# Patient Record
Sex: Female | Born: 1980
Health system: Southern US, Community
[De-identification: ages and names within clinical notes are randomized; demographics above are authoritative.]

## PROBLEM LIST (undated history)

## (undated) ENCOUNTER — Inpatient Hospital Stay (HOSPITAL_COMMUNITY): Payer: Self-pay

## (undated) DIAGNOSIS — Q231 Congenital insufficiency of aortic valve: Secondary | ICD-10-CM

## (undated) DIAGNOSIS — R87619 Unspecified abnormal cytological findings in specimens from cervix uteri: Secondary | ICD-10-CM

## (undated) DIAGNOSIS — IMO0002 Reserved for concepts with insufficient information to code with codable children: Secondary | ICD-10-CM

## (undated) DIAGNOSIS — Q2381 Bicuspid aortic valve: Secondary | ICD-10-CM

## (undated) HISTORY — PX: NO PAST SURGERIES: SHX2092

---

## 2001-10-09 ENCOUNTER — Encounter: Payer: Self-pay | Admitting: Cardiovascular Disease

## 2001-11-11 ENCOUNTER — Encounter: Payer: Self-pay | Admitting: Cardiovascular Disease

## 2002-10-03 ENCOUNTER — Encounter: Payer: Self-pay | Admitting: Cardiovascular Disease

## 2004-07-26 ENCOUNTER — Encounter: Payer: Self-pay | Admitting: Cardiovascular Disease

## 2007-03-26 LAB — CONVERTED CEMR LAB
HDL: 92 mg/dL
LDL Cholesterol: 94 mg/dL

## 2007-03-27 ENCOUNTER — Encounter: Payer: Self-pay | Admitting: Cardiovascular Disease

## 2007-03-27 ENCOUNTER — Encounter: Payer: Self-pay | Admitting: Family Medicine

## 2007-04-10 ENCOUNTER — Encounter: Payer: Self-pay | Admitting: Cardiovascular Disease

## 2007-04-10 ENCOUNTER — Encounter: Payer: Self-pay | Admitting: Family Medicine

## 2007-12-05 ENCOUNTER — Emergency Department: Payer: Self-pay

## 2009-02-28 ENCOUNTER — Emergency Department: Payer: Self-pay | Admitting: Emergency Medicine

## 2009-03-27 ENCOUNTER — Encounter: Payer: Self-pay | Admitting: Family Medicine

## 2009-05-06 ENCOUNTER — Ambulatory Visit: Payer: Self-pay | Admitting: Family Medicine

## 2009-05-06 DIAGNOSIS — G43109 Migraine with aura, not intractable, without status migrainosus: Secondary | ICD-10-CM | POA: Insufficient documentation

## 2009-05-06 DIAGNOSIS — Q231 Congenital insufficiency of aortic valve: Secondary | ICD-10-CM | POA: Insufficient documentation

## 2009-05-06 DIAGNOSIS — R87610 Atypical squamous cells of undetermined significance on cytologic smear of cervix (ASC-US): Secondary | ICD-10-CM | POA: Insufficient documentation

## 2009-05-25 LAB — CONVERTED CEMR LAB: Pap Smear: NORMAL

## 2010-06-07 ENCOUNTER — Ambulatory Visit: Payer: Self-pay | Admitting: Family Medicine

## 2010-06-07 DIAGNOSIS — J309 Allergic rhinitis, unspecified: Secondary | ICD-10-CM | POA: Insufficient documentation

## 2010-06-13 ENCOUNTER — Ambulatory Visit: Payer: Self-pay | Admitting: Cardiovascular Disease

## 2010-06-13 DIAGNOSIS — R0989 Other specified symptoms and signs involving the circulatory and respiratory systems: Secondary | ICD-10-CM | POA: Insufficient documentation

## 2010-06-15 ENCOUNTER — Encounter: Payer: Self-pay | Admitting: Cardiovascular Disease

## 2010-06-22 ENCOUNTER — Encounter: Payer: Self-pay | Admitting: Cardiovascular Disease

## 2010-07-28 ENCOUNTER — Ambulatory Visit: Payer: Self-pay

## 2011-01-24 NOTE — Assessment & Plan Note (Signed)
Summary: NP6/GLC   Visit Type:  Initial Consult Primary Provider:  Kerby Nora MD  CC:  Has episodes of chest pain, left arm numbness, dizziness, and s.o.b. with rapid heart beats.  History of Present Illness: Very pleasant 30 year old woman with a history of bicuspid aortic valve, mild to moderate aortic valve regurgitation, history of  tachy arrhythmias that occur approximately every several months with symptoms of dizziness, shortness of breath and sometimes left arm numbness who presents to establish care.  She was previously seen in [place] and echocardiogram at that time showed mild to moderate AI with bicuspid aortic valve repeat echocardiogram the following year showed no significant change. Notes to indicate that she was having atypical type chest pains with prolonged episodes of chest heaviness with no radiation. She was active but this did not seem to exacerbate her symptoms.  In August 2005 she had atypical type chest pain, sinus tachycardia. EKG showed normal sinus rhythm with a rate of 111 beats per minute.  She does report some episodes of tachycardia lasting for approximately 15 minutes. Occasionally she will have a severe episode lasting for one hour that is much faster and stronger. Her last episode was in December which was significant and she did not go to the emergency room.  EKG shows normal sinus rhythm with rate 73 beats per minute no significant ST or T wave changes.  Last echocardiogram was done at [facility] cardiology and will try to obtain this for her records  Current Medications (verified): 1)  Triphasil .... Take 1 Tablet By Mouth Once A Day 2)  Multivitamins   Tabs (Multiple Vitamin) .... Take 1 Tablet By Mouth Once A Day 3)  Patanol 0.1 % Soln (Olopatadine Hcl) .Marland Kitchen.. 1 Gtt in Eyes Two Times A Day 4)  Zyrtec Allergy 10 Mg Tabs (Cetirizine Hcl) .Marland Kitchen.. 1 Qd  Allergies (verified): 1)  ! Sulfa 2)  ! Penicillin  Past History:  Past Medical History: "Mild to  moderate aortic insufficiency and suggests congenital bicuspid aortic valve";Echo 10/09/2001  Review of Systems  The patient denies fever, weight loss, weight gain, vision loss, decreased hearing, hoarseness, chest pain, syncope, dyspnea on exertion, peripheral edema, prolonged cough, abdominal pain, incontinence, muscle weakness, depression, and enlarged lymph nodes.    Vital Signs:  Patient profile:   30 year old female Height:      62.25 inches Weight:      117 pounds BMI:     21.30 Pulse rate:   73 / minute BP sitting:   106 / 78  (left arm) Cuff size:   regular  Vitals Entered By: Bishop Dublin, CMA (June 13, 2010 3:18 PM)  Physical Exam  General:  Well developed, well nourished, in no acute distress. Head:  normocephalic and atraumatic Neck:  Neck supple, no JVD. No masses, thyromegaly or abnormal cervical nodes. Lungs:  Clear bilaterally to auscultation and percussion. Heart:  Non-displaced PMI, chest non-tender; regular rate and rhythm, S1, S2 with I-II/VI diastolic murmur at RSB, no rubs or gallops. Carotid upstroke normal, no bruit.  Pedals normal pulses. No edema, no varicosities. Abdomen:  Bowel sounds positive; abdomen soft and non-tender without masses Msk:  Back normal, normal gait. Muscle strength and tone normal. Pulses:  pulses normal in all 4 extremities Extremities:  No clubbing or cyanosis. Neurologic:  Alert and oriented x 3. Skin:  Intact without lesions or rashes. Psych:  Normal affect.   Impression & Recommendations:  Problem # 1:  BICUSPID AORTIC VALVE (ICD-746.4) history  of bicuspid aortic valve with mild to moderate AI (mild by echocardiogram in 2003), with latest echocardiogram not available to [place] at this time from 2-3 years ago which was reportedly unchanged from previous echoes. We will order an echocardiogram to evaluate her valve as it has been several years . Orders: Echocardiogram (Echo)  Problem # 2:  TACHYCARDIA (ICD-785) She does report  periods of tachycardia present every several months. I suspect she may have an SVT or an atrial tachycardia. She is not interested in trying medications at this time as they are very infrequent. I've asked her if she has worsening episodes were more frequent, to contact our office for further evaluation.  Patient Instructions: 1)  Your physician wants you to follow-up in:  1 year  You will receive a reminder letter in the mail two months in advance. If you don't receive a letter, please call our office to schedule the follow-up appointment. 2)  Your physician has requested that you have an echocardiogram.  Echocardiography is a painless test that uses sound waves to create images of your heart. It provides your doctor with information about the size and shape of your heart and how well your heart's chambers and valves are working.  This procedure takes approximately one hour. There are no restrictions for this procedure. ANY THURSDAY

## 2011-01-24 NOTE — Assessment & Plan Note (Signed)
Summary: ALLERGIES/DLO   Vital Signs:  Patient profile:   30 year old female Height:      62.25 inches Weight:      115.0 pounds BMI:     20.94 Temp:     98.0 degrees F oral Pulse rate:   68 / minute Pulse rhythm:   regular BP sitting:   90 / 60  (left arm) Cuff size:   regular  Vitals Entered By: Benny Lennert CMA Duncan Dull) (June 07, 2010 11:02 AM)  History of Present Illness: Chief complaint Allergies  In last 2 weeks.Marland Kitchen eyes watering, burning, sinuss pressure, nasal congestion, post nasal drip. Lightheaded, headache. Mild ear pain B.  No fever. No cough, no SOB. Fatigued.  Tried advil sudafed, claritin...minimal improvement. No new environmental triggers.   Also requests cardiology referrla for bicuspid aortic valve..due for q3 year ECHo.   Asymptomatic.   Sees GYN for CPX.    Problems Prior to Update: 1)  Pap Smear, Abnormal, Ascus  (ICD-795.01) 2)  Bicuspid Aortic Valve  (ICD-746.4) 3)  Migraine With Aura  (ICD-346.00)  Current Medications (verified): 1)  Triphasil .... Take 1 Tablet By Mouth Once A Day 2)  Multivitamins   Tabs (Multiple Vitamin) .... Take 1 Tablet By Mouth Once A Day  Allergies: 1)  ! Sulfa 2)  ! Penicillin  Past History:  Past medical, surgical, family and social histories (including risk factors) reviewed, and no changes noted (except as noted below).  Past Surgical History: Reviewed history from 05/06/2009 and no changes required. Denies surgical history Colopscopy:negative...................Marland Kitchen Dr [facility]  Family History: Reviewed history from 05/06/2009 and no changes required. father: HTN, goiter mother:high chol, HTN, hyperthyroid after pregnancy brother:healthy sister: healthy PGF: heart disease of some type, ? cancer MGF: CAD, CABG age 105 MGM: CVA age 99, high chol  Social History: Reviewed history from 05/06/2009 and no changes required. Occupation: Airline pilot Married no children Never Smoked Alcohol  use-no Drug use-yes, remote, no injectables Regular exercise-no Diet: fruits and veggies, water  Review of Systems General:  Denies fatigue and fever. CV:  Denies chest pain or discomfort, fatigue, near fainting, palpitations, swelling of feet, and weight gain. Resp:  Denies chest discomfort. GI:  Denies abdominal pain. GU:  Denies dysuria.  Physical Exam  General:  Well-developed,well-nourished,in no acute distress; alert,appropriate and cooperative throughout examination Head:  no maxilalr sinus ttp Eyes:  vision grossly intact, pupils equal, pupils round, pupils reactive to light, and conjunctival injection.   Ears:  External ear exam shows no significant lesions or deformities.  Otoscopic examination reveals clear canals, tympanic membranes are intact bilaterally without bulging, retraction, inflammation or discharge. Hearing is grossly normal bilaterally. Nose:  nasal dischargemucosal pallor.   Mouth:  Oral mucosa and oropharynx without lesions or exudates.  Teeth in good repair. Neck:  no carotid bruit or thyromegaly no cervical or supraclavicular lymphadenopathy  Lungs:  Normal respiratory effort, chest expands symmetrically. Lungs are clear to auscultation, no crackles or wheezes. Heart:  1/6 dias murmur greatest over RUSB, no rub, no gallop Pulses:  R and L posterior tibial pulses are full and equal bilaterally  Extremities:  no edema   Impression & Recommendations:  Problem # 1:  ALLERGIC RHINITIS WITH CONJUNCTIVITIS (ICD-477.9)  Discussed use of allergy medications and environmental measures.  Start zyrtec and pataday.  Call if symptoms are not improving.   Problem # 2:  BICUSPID AORTIC VALVE (ICD-746.4) Asymptomatic.Marland Kitchendue for q 3 year ECHO evasl. Does not have cardiologist in area.  Reviewed past MD notes.  Orders: Cardiology Referral (Cardiology)  Problem # 3:  Preventive Health Care (ICD-V70.0) Assessment: Comment Only Reviewed preventive care protocols,  scheduled due services, and updated immunizations.   Complete Medication List: 1)  Triphasil  .... Take 1 tablet by mouth once a day 2)  Multivitamins Tabs (Multiple vitamin) .... Take 1 tablet by mouth once a day 3)  Patanol 0.1 % Soln (Olopatadine hcl) .Marland Kitchen.. 1 gtt in eyes two times a day  Patient Instructions: 1)  Zyrtec at bedtime ..in no relief try allegra. 2)  Patanol eye drops for eye symptoms as needed. 3)  Call if not improving in next few weeks.  Prescriptions: PATANOL 0.1 % SOLN (OLOPATADINE HCL) 1 gtt in eyes two times a day  #1 x 2   Entered and Authorized by:   Kerby Nora MD   Signed by:   Kerby Nora MD on 06/07/2010   Method used:   [facility]        [facility]       [address]       [place], [place]  [address]       Ph: [phone]       Fax: [phone]   RxID:   [id number]   Current Allergies (reviewed today): ! SULFA ! PENICILLIN  TD Result Date:  12/25/2008 TD Result:  given TD Next Due:  10 yr Last PAP:  abnormal (03/25/2008 8:45:42 AM) PAP Result Date:  05/25/2009 PAP Result:  normal PAP Next Due:  1 yr

## 2011-01-24 NOTE — Letter (Signed)
Summary: record release  record release   Imported By: Frazier Butt Chriscoe 06/22/2010 15:32:54  _____________________________________________________________________  External Attachment:    Type:   Image     Comment:   External Document

## 2011-01-24 NOTE — Letter (Signed)
Summary: PHI  PHI   Imported By: Harlon Flor 06/15/2010 08:47:19  _____________________________________________________________________  External Attachment:    Type:   Image     Comment:   External Document

## 2011-05-26 ENCOUNTER — Inpatient Hospital Stay (HOSPITAL_COMMUNITY): Admit: 2011-05-26 | Payer: Self-pay | Admitting: Obstetrics & Gynecology

## 2011-07-19 ENCOUNTER — Encounter: Payer: Self-pay | Admitting: Cardiovascular Disease

## 2011-08-25 LAB — HEPATITIS B SURFACE ANTIGEN: Hepatitis B Surface Ag: NEGATIVE

## 2011-08-25 LAB — HIV ANTIBODY (ROUTINE TESTING W REFLEX): HIV: NONREACTIVE

## 2011-08-25 LAB — ABO/RH: RH Type: POSITIVE

## 2011-08-25 LAB — RUBELLA ANTIBODY, IGM: Rubella: IMMUNE

## 2011-12-26 NOTE — L&D Delivery Note (Signed)
Delivery Note At 9:02 PM a viable female was delivered via Vaginal, Spontaneous Delivery (Presentation DOA w/ double nuchal arms).  APGAR: 8, 8; weight 6 lb 14.9 oz (3145 g).  Over last 20 minutes baby w/ decels to 80-90's that lasted up to 1-2 min with pushing, good recovery btwn ctx but over the last 5-10 min recovery only to to the low 100s. Placenta status: Intact, w/ pitocin, uterine massage and maternal pushing after 25 minutes, Spontaneous.  Cord: 3 vessels with the following complications: None.  Cord pH: 7.29  Anesthesia: Local  Episiotomy: None Lacerations: 2nd degree;Perineal, L periurethral Suture Repair: 2.0 3.0 vicryl rapide. 2nd degree laceration repaired with 2-0 vicryl rapide in standard fashion, L periurethral splaying closed w/ 4-0 vicryl rapide and R labia majora splaying reapproximated with single figure of 8 of 3-0 vicryl. Est. Blood Loss (mL): 500cc  Mom to postpartum.  Baby to nursery-stable.  Lindsey Juarez A. 04/05/2012, 10:32 PM

## 2012-04-05 ENCOUNTER — Encounter (HOSPITAL_COMMUNITY): Payer: Self-pay | Admitting: *Deleted

## 2012-04-05 ENCOUNTER — Inpatient Hospital Stay (HOSPITAL_COMMUNITY)
Admission: AD | Admit: 2012-04-05 | Discharge: 2012-04-07 | DRG: 373 | Disposition: A | Discharge status: Home / Self Care | Payer: BC Managed Care – PPO | Source: Ambulatory Visit | Attending: Obstetrics & Gynecology | Admitting: Obstetrics & Gynecology

## 2012-04-05 DIAGNOSIS — O99892 Other specified diseases and conditions complicating childbirth: Secondary | ICD-10-CM | POA: Diagnosis present

## 2012-04-05 DIAGNOSIS — O9903 Anemia complicating the puerperium: Secondary | ICD-10-CM | POA: Diagnosis not present

## 2012-04-05 DIAGNOSIS — R55 Syncope and collapse: Secondary | ICD-10-CM | POA: Diagnosis present

## 2012-04-05 DIAGNOSIS — Q231 Congenital insufficiency of aortic valve: Secondary | ICD-10-CM

## 2012-04-05 DIAGNOSIS — D62 Acute posthemorrhagic anemia: Secondary | ICD-10-CM | POA: Diagnosis not present

## 2012-04-05 DIAGNOSIS — I359 Nonrheumatic aortic valve disorder, unspecified: Secondary | ICD-10-CM

## 2012-04-05 HISTORY — DX: Bicuspid aortic valve: Q23.81

## 2012-04-05 HISTORY — DX: Reserved for concepts with insufficient information to code with codable children: IMO0002

## 2012-04-05 HISTORY — DX: Unspecified abnormal cytological findings in specimens from cervix uteri: R87.619

## 2012-04-05 HISTORY — DX: Congenital insufficiency of aortic valve: Q23.1

## 2012-04-05 LAB — CBC
MCH: 31.4 pg (ref 26.0–34.0)
MCHC: 34.5 g/dL (ref 30.0–36.0)
Platelets: 249 10*3/uL (ref 150–400)
RDW: 13 % (ref 11.5–15.5)

## 2012-04-05 LAB — TYPE AND SCREEN: Antibody Screen: NEGATIVE

## 2012-04-05 LAB — ABO/RH: ABO/RH(D): A POS

## 2012-04-05 LAB — GC/CHLAMYDIA PROBE AMP, GENITAL: Gonorrhea: NEGATIVE

## 2012-04-05 MED ORDER — OXYTOCIN BOLUS FROM INFUSION
500.0000 mL | Freq: Once | INTRAVENOUS | Status: DC
  Filled 2012-04-05: qty 1000
  Filled 2012-04-05: qty 500

## 2012-04-05 MED ORDER — LIDOCAINE HCL (PF) 1 % IJ SOLN
30.0000 mL | INTRAMUSCULAR | Status: DC | PRN
  Administered 2012-04-05: 30 mL via SUBCUTANEOUS
  Filled 2012-04-05: qty 30

## 2012-04-05 MED ORDER — CITRIC ACID-SODIUM CITRATE 334-500 MG/5ML PO SOLN: 30.0000 mL | ORAL | Status: DC | PRN

## 2012-04-05 MED ORDER — ONDANSETRON HCL 4 MG/2ML IJ SOLN: 4.0000 mg | Freq: Four times a day (QID) | INTRAMUSCULAR | Status: DC | PRN

## 2012-04-05 MED ORDER — IBUPROFEN 600 MG PO TABS: 600.0000 mg | ORAL_TABLET | Freq: Four times a day (QID) | ORAL | Status: DC | PRN

## 2012-04-05 MED ORDER — PHENYLEPHRINE 40 MCG/ML (10ML) SYRINGE FOR IV PUSH (FOR BLOOD PRESSURE SUPPORT)
PREFILLED_SYRINGE | INTRAVENOUS | Status: AC
  Administered 2012-04-05: 80 ug via INTRAVENOUS
  Filled 2012-04-05: qty 5

## 2012-04-05 MED ORDER — OXYCODONE-ACETAMINOPHEN 5-325 MG PO TABS: 1.0000 | ORAL_TABLET | ORAL | Status: DC | PRN

## 2012-04-05 MED ORDER — OXYTOCIN 20 UNITS IN LACTATED RINGERS INFUSION - SIMPLE
125.0000 mL/h | Freq: Once | INTRAVENOUS | Status: AC
  Administered 2012-04-05: 125 mL/h via INTRAVENOUS

## 2012-04-05 MED ORDER — PHENYLEPHRINE 40 MCG/ML (10ML) SYRINGE FOR IV PUSH (FOR BLOOD PRESSURE SUPPORT)
80.0000 ug | PREFILLED_SYRINGE | INTRAVENOUS | Status: DC | PRN
  Administered 2012-04-05: 80 ug via INTRAVENOUS

## 2012-04-05 MED ORDER — LACTATED RINGERS IV SOLN
500.0000 mL | INTRAVENOUS | Status: DC | PRN
  Administered 2012-04-05: 1000 mL via INTRAVENOUS

## 2012-04-05 MED ORDER — FLEET ENEMA 7-19 GM/118ML RE ENEM: 1.0000 | ENEMA | RECTAL | Status: DC | PRN

## 2012-04-05 MED ORDER — ACETAMINOPHEN 325 MG PO TABS: 650.0000 mg | ORAL_TABLET | ORAL | Status: DC | PRN

## 2012-04-05 MED ORDER — LACTATED RINGERS IV SOLN
INTRAVENOUS | Status: DC
  Administered 2012-04-05: 19:00:00 via INTRAVENOUS

## 2012-04-05 NOTE — Progress Notes (Signed)
Pt delivered viable female with APGARS 8,9 SVD. Dr Ernestina Penna present.

## 2012-04-05 NOTE — Progress Notes (Signed)
Pt transferred to room 372 ICU via stretcher in stable condition. Report given to RN Steward Drone

## 2012-04-05 NOTE — Progress Notes (Signed)
MD at bedside for anesthesia consult.

## 2012-04-05 NOTE — MAU Note (Signed)
Pt in for labor eval, reports ucs since last night, have progressively gotten worse throughout day.  Reports small amount of vaginal bleeding.  + FM.

## 2012-04-05 NOTE — H&P (Signed)
Lindsey Juarez is a 31 y.o. G1P0 at [redacted]w[redacted]d presenting for labor. Pt notes onset contractions last night but they started getting significantly worse later this afternoon . Good fetal movement, No vaginal bleeding,  leaking fluid after ROM en route from MAU to L&D.  [facility] at [facility] since 7 wks - dated by LMP - bicuspid aortic valve and aortic insufficiency, pt states last echo 2 yrs echo. Dx at age 71 and pt states no changes on echo qoyr. Pt states cards told her she would not have a problem with pregnancy as her insufficiency is mild. No maternal or fetal echo this preg - migraines- controlled w/ diet - u/s at 36 wks- ant placenta, growth 55%  Meds: PNV, Zyrtec All: Sulfa (hives), PCN (face rash, no hives as a child)  OB History    Grav Para Term Preterm Abortions TAB SAB Ect Mult Living   1              Past Medical History  Diagnosis Date  . Aortic insufficiency     mild-moderate; suggests cogenital bicuspid aortic vlave; echo 10/09/01  . Abnormal Pap smear   . ASCUS on Pap smear   . Bicuspid aortic valve    Past Surgical History  Procedure Date  . No past surgeries    Family History: family history includes Coronary artery disease in her maternal grandfather; Goiter in her father; Heart disease in her paternal grandfather; Hypertension in her father and mother; and Stroke (age of onset:40) in her maternal grandmother.  There is no history of Anesthesia problems. Social History:  reports that she quit smoking about 13 years ago. She does not have any smokeless tobacco history on file. She reports that she does not drink alcohol or use illicit drugs.  Review of Systems - Negative except ctx, LOF   Dilation: 4.5 Effacement (%): 100 Station: -1 Exam by:: Cletis Media, RN Blood pressure 136/77, pulse 73, temperature 97.9 F (36.6 C), temperature source Oral, resp. rate 20, height 5\' 2"  (1.575 m), weight 61.236 kg (135 lb).  Physical Exam: well appearing, uncomfortable  with ctx Gen: well appearing, no distress CV: RRR Pulm: CTAB, no wheeze Back: no CVAT Abd: gravid, NT, no RUQ pain LE: no edema, equal bilaterally, non-tender Toco: q 3 min FH: baseline 130s, accelerations present, no deceleratons, 10 beat variability  Prenatal labs: ABO, Rh: A/Positive/-- (08/31 0000) Antibody: Negative (08/31 0000) Rubella:  immune RPR: Nonreactive (08/31 0000)  HBsAg: Negative (08/31 0000)  HIV: Non-reactive (08/31 0000)  GBS: Negative (03/14 0000)  1 hr Glucola 101   Genetic screening nl AFP Anatomy US nl female   Assessment/Plan: 31 y.o. G1P0 at [redacted]w[redacted]d Active labor. Expectant management at this time Reactive fetal testing GBS neg Aortic insufficiency, due to the risk of acute decompensation with fluid shifts of labor and delivery, along with increased risks w/ anasthetics/ need for surgery, will obtain stat echo and cards input.   Lindsey Juarez A. 04/05/2012 7:36 PM     Lindsey Juarez A. 04/05/2012, 7:24 PM

## 2012-04-06 ENCOUNTER — Encounter (HOSPITAL_COMMUNITY): Payer: Self-pay | Admitting: Cardiovascular Disease

## 2012-04-06 DIAGNOSIS — R55 Syncope and collapse: Secondary | ICD-10-CM | POA: Diagnosis present

## 2012-04-06 DIAGNOSIS — I359 Nonrheumatic aortic valve disorder, unspecified: Secondary | ICD-10-CM

## 2012-04-06 LAB — CBC
MCV: 92.2 fL (ref 78.0–100.0)
Platelets: 232 10*3/uL (ref 150–400)
RBC: 2.94 MIL/uL — ABNORMAL LOW (ref 3.87–5.11)
RDW: 13.1 % (ref 11.5–15.5)
WBC: 25.8 10*3/uL — ABNORMAL HIGH (ref 4.0–10.5)

## 2012-04-06 LAB — MRSA PCR SCREENING: MRSA by PCR: NEGATIVE

## 2012-04-06 MED ORDER — DIBUCAINE 1 % RE OINT: 1.0000 "application " | TOPICAL_OINTMENT | RECTAL | Status: DC | PRN

## 2012-04-06 MED ORDER — ONDANSETRON HCL 4 MG PO TABS: 4.0000 mg | ORAL_TABLET | ORAL | Status: DC | PRN

## 2012-04-06 MED ORDER — SENNOSIDES-DOCUSATE SODIUM 8.6-50 MG PO TABS
2.0000 | ORAL_TABLET | Freq: Every day | ORAL | Status: DC
  Administered 2012-04-06: 2 via ORAL

## 2012-04-06 MED ORDER — BENZOCAINE-MENTHOL 20-0.5 % EX AERO
INHALATION_SPRAY | CUTANEOUS | Status: AC
  Administered 2012-04-06: 03:00:00
  Filled 2012-04-06: qty 56

## 2012-04-06 MED ORDER — WITCH HAZEL-GLYCERIN EX PADS: 1.0000 "application " | MEDICATED_PAD | CUTANEOUS | Status: DC | PRN

## 2012-04-06 MED ORDER — TETANUS-DIPHTH-ACELL PERTUSSIS 5-2.5-18.5 LF-MCG/0.5 IM SUSP
0.5000 mL | Freq: Once | INTRAMUSCULAR | Status: DC
  Filled 2012-04-06: qty 0.5

## 2012-04-06 MED ORDER — BISACODYL 10 MG RE SUPP: 10.0000 mg | Freq: Every day | RECTAL | Status: DC | PRN

## 2012-04-06 MED ORDER — PRENATAL MULTIVITAMIN CH
1.0000 | ORAL_TABLET | Freq: Every day | ORAL | Status: DC
  Administered 2012-04-06: 1 via ORAL
  Filled 2012-04-06: qty 1

## 2012-04-06 MED ORDER — OXYCODONE-ACETAMINOPHEN 5-325 MG PO TABS
1.0000 | ORAL_TABLET | ORAL | Status: DC | PRN
Start: 2012-04-06 — End: 2012-04-07

## 2012-04-06 MED ORDER — SIMETHICONE 80 MG PO CHEW
80.0000 mg | CHEWABLE_TABLET | ORAL | Status: DC | PRN
  Administered 2012-04-06: 80 mg via ORAL

## 2012-04-06 MED ORDER — IBUPROFEN 600 MG PO TABS
600.0000 mg | ORAL_TABLET | Freq: Four times a day (QID) | ORAL | Status: DC
  Administered 2012-04-06 – 2012-04-07 (×6): 600 mg via ORAL
  Filled 2012-04-06 (×6): qty 1

## 2012-04-06 MED ORDER — LANOLIN HYDROUS EX OINT: TOPICAL_OINTMENT | CUTANEOUS | Status: DC | PRN

## 2012-04-06 MED ORDER — DIPHENHYDRAMINE HCL 25 MG PO CAPS
25.0000 mg | ORAL_CAPSULE | Freq: Four times a day (QID) | ORAL | Status: DC | PRN
Start: 2012-04-06 — End: 2012-04-07

## 2012-04-06 MED ORDER — BENZOCAINE-MENTHOL 20-0.5 % EX AERO: 1.0000 "application " | INHALATION_SPRAY | CUTANEOUS | Status: DC | PRN

## 2012-04-06 MED ORDER — LACTATED RINGERS IV SOLN: INTRAVENOUS | Status: DC

## 2012-04-06 MED ORDER — ONDANSETRON HCL 4 MG/2ML IJ SOLN: 4.0000 mg | INTRAMUSCULAR | Status: DC | PRN

## 2012-04-06 MED ORDER — FLEET ENEMA 7-19 GM/118ML RE ENEM: 1.0000 | ENEMA | Freq: Every day | RECTAL | Status: DC | PRN

## 2012-04-06 MED ORDER — ZOLPIDEM TARTRATE 5 MG PO TABS: 5.0000 mg | ORAL_TABLET | Freq: Every evening | ORAL | Status: DC | PRN

## 2012-04-06 NOTE — Progress Notes (Signed)
04/06/12 1410 SBar report called to Kris Hartmann, RN for pt transfer to [facility] rm #[id number].

## 2012-04-06 NOTE — Consult Note (Signed)
Reason for Consult:Syncope, abnormal cardiac valves Referring Physician: Dr Windell Moment Lindsey Juarez is an 31 y.o. female with a history of palpitations, bicuspid aortic valve with varying degrees of aortic insufficiency over the years(mild to moderate).  HPI: She has been followed in the past by Dr. Dossie Arbour. She was diagnosed in 2002 at the age of 18 yrs while being evaluated for her palpitations. She used to have 6 monthly, then yearly, then q3 yrly echo but was never started on any medication or offered surgery. She just had an uneventful pregnancy and [facility].Following delivery, she went to the bathroom and while on the toilet seat, she started feeling lightheaded and nauseated and blacked out. She was held up by the nurses who helped her off the toilet seat. Her BP dropped to a systolic of while her heart rate that had consistently being in the 90s to 100 dropped down to the 50s. This episode quickly resolved and her vitals recovered with time and infusion of 1 liter bag of saline. At the time of evaluation she was back to her normal self and had no complaints. Of note she has been having 1-2 episodes a year of rapid sensation of the heart beating. The suspicion is that she has an "electrical problem" (arrhythmia most likely SVT) and has been told to present to the hospital in to document it on EKG the next time it happens. She particularly denied having palpitations last night.   Past Medical History  Diagnosis Date  . Aortic insufficiency     mild-moderate; suggests cogenital bicuspid aortic vlave; echo 10/09/01  . Abnormal Pap smear   . ASCUS on Pap smear   . Bicuspid aortic valve     Past Surgical History  Procedure Date  . No past surgeries     Family History  Problem Relation Age of Onset  . Goiter Father   . Hypertension Father   . Hypertension Mother     also high chol and hyperthyroid after pregnancy  . Heart disease  Paternal Grandfather     ?cancer  . Coronary artery disease Maternal Grandfather     CABG at 48  . Stroke Maternal Grandmother 40    high chol  . Anesthesia problems Neg Hx     Social History:  reports that she quit smoking about 13 years ago. She does not have any smokeless tobacco history on file. She reports that she does not drink alcohol or use illicit drugs.  Allergies:  Allergies  Allergen Reactions  . Penicillins     REACTION: Rash  . Sulfonamide Derivatives     REACTION: Hives, swelling    Medications: I have reviewed the patient's current medications.  Results for orders placed during the hospital encounter of 04/05/12 (from the past 48 hour(s))  GC/CHLAMYDIA PROBE AMP, GENITAL     Status: Normal      Component Value Range Comment   Gonorrhea Negative      Chlamydia Negative     CBC     Status: Abnormal   Collection Time   04/05/12  7:15 PM      Component Value Range Comment   WBC 17.3 (*) 4.0 - 10.5 (K/uL)    RBC 3.82 (*) 3.87 - 5.11 (MIL/uL)    Hemoglobin 12.0  12.0 - 15.0 (g/dL)    HCT 16.1 (*) 09.6 - 46.0 (%)    MCV 91.1  78.0 - 100.0 (fL)    MCH  31.4  26.0 - 34.0 (pg)    MCHC 34.5  30.0 - 36.0 (g/dL)    RDW 16.1  09.6 - 04.5 (%)    Platelets 249  150 - 400 (K/uL)   RPR     Status: Normal   Collection Time   04/05/12  7:15 PM      Component Value Range Comment   RPR NON REACTIVE  NON REACTIVE    TYPE AND SCREEN     Status: Normal   Collection Time   04/05/12  7:15 PM      Component Value Range Comment   ABO/RH(D) A POS      Antibody Screen NEG      Sample Expiration 04/08/2012     ABO/RH     Status: Normal   Collection Time   04/05/12  7:15 PM      Component Value Range Comment   ABO/RH(D) A POS     MRSA PCR SCREENING     Status: Normal   Collection Time   04/06/12 12:40 AM      Component Value Range Comment   MRSA by PCR NEGATIVE  NEGATIVE      No results found.  Review of Systems  Constitutional: Negative for diaphoresis.  HENT: Negative.    Eyes: Negative.   Respiratory: Negative.   Cardiovascular: Negative for chest pain, palpitations, orthopnea, claudication, leg swelling and PND.  Gastrointestinal: Positive for nausea.  Genitourinary: Negative.   Musculoskeletal: Negative.   Skin: Negative.   Neurological: Positive for dizziness, loss of consciousness and weakness.  Endo/Heme/Allergies: Negative.   Psychiatric/Behavioral: Negative.    Blood pressure 93/52, pulse 74, temperature 98.4 F (36.9 C), temperature source Oral, resp. rate 16, height 5\' 3"  (1.6 m), weight 129 lb 12.8 oz (58.877 kg), SpO2 99.00%, unknown if currently breastfeeding. Physical Exam  Constitutional: She is oriented to person, place, and time. She appears well-developed and well-nourished. No distress.  HENT:  Head: Normocephalic.  Mouth/Throat: No oropharyngeal exudate.  Eyes: Conjunctivae are normal. Pupils are equal, round, and reactive to light.  Neck: Normal range of motion. Neck supple. No JVD present.  Cardiovascular: S1 normal and S2 normal.  Exam reveals gallop and S3.   Murmur heard.  Diastolic murmur is present with a grade of 1/6  Respiratory: Effort normal and breath sounds normal. No respiratory distress. She has no wheezes. She has no rales. She exhibits no tenderness.  GI: Soft. She exhibits no distension.  Musculoskeletal: Normal range of motion.  Neurological: She is alert and oriented to person, place, and time. A cranial nerve deficit is present. Coordination abnormal.  Skin: Skin is warm and dry. She is not diaphoretic.  Psychiatric: She has a normal mood and affect. Her behavior is normal. Judgment and thought content normal.   Old EKG- normal , incompletee RBBB  Assessment/Plan: 1) Vasovagal syncope 2) Bicuspid aortic valve 3) Hx of aortic insufficiency 4) ? SVT  PLAN - Obtain EKG - Obtain 2D echo in the morning - Complete 1L of normal saline which is infusing and then stop. If 2D echo is stable, she will follow  up as an out patient.  Grandville Silos 04/06/2012, 5:03 AM

## 2012-04-06 NOTE — Progress Notes (Signed)
Subjective:  No recurrent syncope.  Feels good. Not SOB.  Objective:  Vital Signs in the last 24 hours: BP 105/73  Pulse 88  Temp(Src) 98.2 F (36.8 C) (Oral)  Resp 19  Ht 5\' 3"  (1.6 m)  Wt 58.877 kg (129 lb 12.8 oz)  BMI 22.99 kg/m2  SpO2 99%  Breastfeeding? Unknown  Physical Exam: Pleasant WF in NAD Lungs:  Clear Cardiac:  Regular rhythm, normal S1 and S2, no S3 2/6 systolic murmur of AS and 1/6 murmur of AI Extremities:  No edema present  Intake/Output from previous day: 04/12 0701 - 04/13 0700 In: 1195 [P.O.:220; I.V.:975] Out: 400 [Urine:400] Weight Filed Weights   04/05/12 1830 04/06/12 0008  Weight: 61.236 kg (135 lb) 58.877 kg (129 lb 12.8 oz)    CBC:  Basename 04/06/12 0505 04/05/12 1915  WBC 25.8* 17.3*  NEUTROABS -- --  HGB 9.2* 12.0  HCT 27.1* 34.8*  MCV 92.2 91.1  PLT 232 249   Telemetry: No arrhythmias  Assessment/Plan:  1. Vasovagal syncope no recurrence 2. Bicuspid AV with mild AI and mild to moderate AS  Rec:  OK to go to floor and home and resume normal activities.  She should f/u with Dr. Lewie Loron in 4-6 weeks for her routine f/u for her heart.  Darden Palmer  MD [facility]  04/06/2012, 12:25 PM

## 2012-04-06 NOTE — Progress Notes (Signed)
Pt sleeping comfortable, no chest pain, no SOB  Filed Vitals:   04/06/12 0200 04/06/12 0240 04/06/12 0300 04/06/12 0400  BP: 101/66  90/61 93/52  Pulse: 85 88 81 74  Temp:    98.4 F (36.9 C)  TempSrc:      Resp: 19 17 16 16   Height:      Weight:      SpO2: 98% 98% 98% 99%   CV: RRR, no appreciable murmurs Pulm: CTAB, no wheezes LE: no edema  CBC in am  A/P: 7 hrs PP from SVD w/ rapid labor and h/o aortic insufficiency and syncopal episode post-delivery - cardiology consult called for evaluation of her aortic insufficiency due to possible changes w/ pregnancy. Cards to arrange echo in am, will cont pt on AICU until results back for closer monitoring. Syncopal episode most like orthostatic due to delivery. No evidence fluid overload.  - breastfeeding - planning circumsion  Aaric Dolph A. 04/06/2012 4:20 AM

## 2012-04-06 NOTE — Progress Notes (Signed)
  Echocardiogram 2D Echocardiogram has been performed.  Emelia Loron A 04/06/2012, 10:58 AM

## 2012-04-06 NOTE — Progress Notes (Signed)
Cardiology consult at BS.

## 2012-04-06 NOTE — Progress Notes (Signed)
PPD 1  Pt notes no CP, no SOB, no dizziness, no heart palpitation. Up to void several times overnight with no problems. tol reg po. Pain controlled. Mild  Lochia. Breast feeding.  Filed Vitals:   04/06/12 0240 04/06/12 0300 04/06/12 0400 04/06/12 0507  BP:  90/61 93/52 109/73  Pulse: 88 81 74 65  Temp:   98.4 F (36.9 C)   TempSrc:      Resp: 17 16 16 18   Height:      Weight:      SpO2: 98% 98% 99% 99%   Gen: well appearing CV: RRR, no appreciable murmur  Pulm: CTAB, no wheezes Abd: NT, ND, fundus firm/ NT GU: mild lochia LE: no edema  CBC    Component Value Date/Time   WBC 25.8* 04/06/2012 0505   RBC 2.94* 04/06/2012 0505   HGB 9.2* 04/06/2012 0505   HCT 27.1* 04/06/2012 0505   PLT 232 04/06/2012 0505   MCV 92.2 04/06/2012 0505   MCH 31.3 04/06/2012 0505   MCHC 33.9 04/06/2012 0505   RDW 13.1 04/06/2012 0505    A/P: 31 yo G1P1 PPD 1 s/p SVD - PP. Pt doing well from a PP standpoint. Cont routine PP care - Bicuspid aortic valve. S/p cards consult. Echo pending this am. No evidence fluid overload at this time. If echo stable will transfer to PP floor this morning  Richie Bonanno A. 04/06/2012 8:19 AM

## 2012-04-06 NOTE — Progress Notes (Signed)
New Admit.  Pt rec'd via bed from BS for telemetry and obs.  G1P1, 39.[redacted] wks gestation, SVD 04/05/12 at 2102.  Viable female infant, wt 6lbs., 14 oz.  Apgars 8/8.  CN.  Breast feeding.  Hx bicuspid aortic valve - aortic insufficiency, aortic regurgitation - mild/moderate. Diagnosed 2001, last ECHO 2 yrs ago.  Also Hx migraines with aura, allergic rhinitis, abnormal PAP.   Cardiology notified per Dr. Ernestina Penna - will be in this am for consult.   Syncope episode while in BS (up to BR), bradycardia and hypotensive - improved with IV fluids, 80 mcg Phenylepherine per anesthesia.

## 2012-04-06 NOTE — Progress Notes (Signed)
2D Echo complete.

## 2012-04-07 MED ORDER — IBUPROFEN 600 MG PO TABS: 600.0000 mg | ORAL_TABLET | Freq: Four times a day (QID) | ORAL | Status: AC

## 2012-04-07 MED ORDER — FERRALET 90 90-1 MG PO TABS: 1.0000 | ORAL_TABLET | Freq: Every day | ORAL | Status: DC

## 2012-04-07 NOTE — Discharge Summary (Signed)
Obstetric Discharge Summary Reason for Admission: onset of labor Prenatal Procedures: ultrasound Intrapartum Procedures: spontaneous vaginal delivery Postpartum Procedures: cardiac 2D echo and EKG Complications-Operative and Postpartum: bicuspid aortic valve and aortic insufficiency. Patient experienced a vasovagal syncopal episode with first void post vaginal birth. She was admitted to [facility] for 24 hour observation and cardiologist consult. She was deemed stable for discharge and return to normal activities post cardio work-up.  Hemoglobin  Date Value Range Status  04/06/2012 9.2* 12.0-15.0 (g/dL) Final     REPEATED TO VERIFY     DELTA CHECK NOTED     HCT  Date Value Range Status  04/06/2012 27.1* 36.0-46.0 (%) Final    Physical Exam:  General: alert, cooperative and no distress Lochia: appropriate Uterine Fundus: firm Incision: healing well DVT Evaluation: Negative Homan's sign. No significant calf/ankle edema.  Discharge Diagnoses: Term Pregnancy-delivered and bicuspid aortic valve and aortic insufficiency Maternal anemia asymptomatic  Discharge Information: Date: 04/07/2012 Activity: pelvic rest Diet: routine Medications: PNV, Ferralet, and Ibuprofen Condition: stable Instructions: refer to practice specific booklet Discharge to: home Follow-up Information    Follow up with LAVOIE,MARIE-LYNE, MD in 6 weeks.   Contact information:   [address] [place] [address] [phone]       Follow up with Julien Nordmann, MD. Schedule an appointment as soon as possible for a visit in 4 weeks. (follow up cardiac status)    Contact information:   [address] [place] [address] [phone]          Newborn Data: Live born female "Lindwood Qua" Birth Weight: 6 lb 14.9 oz (3145 g) APGAR: 8, 8  Home with mother.  Judythe Postema 04/07/2012, 10:51 AM

## 2012-04-07 NOTE — Progress Notes (Signed)
PPD 2 SVD  S:  Reports feeling well, no recurrent syncope or SOB.              Tolerating po/ No nausea or vomiting             Bleeding is decreased.              Pain controlled with motrin.              Up ad lib / ambulatory / voiding well.   Newborn  Information for the patient's newborn:  Evangelynn, Lochridge [[id number]]  female  breast feeding  / Circumcision done   O:  A & O x 3 NAD             VS:  Filed Vitals:   04/06/12 1208 04/06/12 1236 04/06/12 2245 04/07/12 0605  BP:  104/58 100/66 97/69  Pulse:  81 96 81  Temp:   98.9 F (37.2 C) 98.1 F (36.7 C)  TempSrc:   Oral Oral  Resp: 20 18 18 18   Height:      Weight:      SpO2:  98%      LABS:  Basename 04/06/12 0505 04/05/12 1915  WBC 25.8* 17.3*  HGB 9.2* 12.0  HCT 27.1* 34.8*  PLT 232 249    I&O: I/O last 3 completed shifts: In: 1195 [P.O.:220; I.V.:975] Out: 400 [Urine:400]      Lungs: Clear and unlabored  Heart: regular rate and rhythm /  Mild systolic murmur  Abdomen: soft, non-tender, non-distended              Fundus: firm, non-tender, U -1  Perineum: no edema, mild ecchymosis   Lochia: scant  Extremities: no  edema, no calf pain or tenderness, neg Homans    A/P: PPD # 2 31 y.o., G1P1001    Principal Problem:  *Syncope  Episode of vasovagal syncope shortly post NVB with first ambulation and void, no repeat  Bicuspid AV  Cardio consult and 2D echo completed yesterday, patient cleared for normal activity  Plan f/u with Dr. Lewie Loron 4-6 weeks post-partum  ABL anemia  Asymptomatic  Start oral Fe and continue 4-6 weeks  PP care - s/p NVB 4/12   Doing well - stable status  Routine post partum orders  DC home with WOB instructions  Kynley Metzger, CNM, MSN 04/07/2012, 10:20 AM

## 2012-05-07 ENCOUNTER — Encounter: Payer: Self-pay | Admitting: Cardiovascular Disease

## 2012-05-07 ENCOUNTER — Ambulatory Visit (INDEPENDENT_AMBULATORY_CARE_PROVIDER_SITE_OTHER): Payer: BC Managed Care – PPO | Admitting: Cardiovascular Disease

## 2012-05-07 VITALS — BP 105/72 | HR 65 | Ht 63.0 in | Wt 121.0 lb

## 2012-05-07 DIAGNOSIS — R55 Syncope and collapse: Secondary | ICD-10-CM

## 2012-05-07 DIAGNOSIS — I359 Nonrheumatic aortic valve disorder, unspecified: Secondary | ICD-10-CM

## 2012-05-07 DIAGNOSIS — Q231 Congenital insufficiency of aortic valve: Secondary | ICD-10-CM

## 2012-05-07 NOTE — Patient Instructions (Signed)
You are doing well. No medication changes were made.  We would recommend an echocardiogram every few years.   Please call us if you have new issues that need to be addressed before your next appt.  Your physician wants you to follow-up in: 12 months.  You will receive a reminder letter in the mail two months in advance. If you don't receive a letter, please call our office to schedule the follow-up appointment.

## 2012-05-07 NOTE — Assessment & Plan Note (Signed)
Syncope following delivery likely vasovagal. Improved with anesthesia involvement. No further episodes since that time. No structural/valve issues that would have contributed to her  Syncope. No further workup at this time unless she has additional episodes .at that time we would consider a Holter monitor if needed .

## 2012-05-07 NOTE — Progress Notes (Signed)
   Patient ID: Lindsey Juarez, female    DOB: 25-Aug-1981, 31 y.o.   MRN: [id number]  HPI Comments: Very pleasant 31 year old woman with a history of bicuspid aortic valve, mild to moderate aortic valve regurgitation seen as far back as 2002, previous history of  tachy arrhythmias with symptoms of dizziness, shortness of breath and sometimes left arm numbness who presents for routine follow up.    She recently had an uncomplicated pregnancy. Following delivery  by her report, she had syncope. She reports after the fact that her blood pressure was very low and that anesthesia gave her something with improvement of her blood pressure and symptoms.  Anesthesia was very concerned about history of bicuspid valve and  she had a repeat echocardiogram. This was done in [place] at [facility]. Echocardiogram showed mild aortic valve regurgitation, bicuspid aortic valve, minimal aortic valve stenosis by velocity and gradient,  mild TR. Otherwise normal study. There was no significant change in the past 11 years.  She otherwise feels well, no significant shortness of breath or edema. Baby is doing well. No recent chest pain or tachycardia for at least one year.   EKG shows normal sinus rhythm with rate 65 beats per minute no significant ST or T wave changes.   Outpatient Encounter Prescriptions as of 05/07/2012  Medication Sig Dispense Refill  . Multiple Vitamin (MULITIVITAMIN WITH MINERALS) TABS Take 1 tablet by mouth daily.       Review of Systems  Constitutional: Negative.   HENT: Negative.   Eyes: Negative.   Respiratory: Negative.   Cardiovascular: Negative.   Gastrointestinal: Negative.   Musculoskeletal: Negative.   Skin: Negative.   Neurological: Negative.   Hematological: Negative.   Psychiatric/Behavioral: Negative.   All other systems reviewed and are negative.    BP 105/72  Pulse 65  Ht 5\' 3"  (1.6 m)  Wt 121 lb (54.885 kg)  BMI 21.43 kg/m2  Physical Exam  Nursing note and  vitals reviewed. Constitutional: She is oriented to person, place, and time. She appears well-developed and well-nourished.  HENT:  Head: Normocephalic.  Nose: Nose normal.  Mouth/Throat: Oropharynx is clear and moist.  Eyes: Conjunctivae are normal. Pupils are equal, round, and reactive to light.  Neck: Normal range of motion. Neck supple. No JVD present.  Cardiovascular: Normal rate, regular rhythm, S1 normal, S2 normal, normal heart sounds and intact distal pulses.  Exam reveals no gallop and no friction rub.   No murmur heard. Pulmonary/Chest: Effort normal and breath sounds normal. No respiratory distress. She has no wheezes. She has no rales. She exhibits no tenderness.  Abdominal: Soft. Bowel sounds are normal. She exhibits no distension. There is no tenderness.  Musculoskeletal: Normal range of motion. She exhibits no edema and no tenderness.  Lymphadenopathy:    She has no cervical adenopathy.  Neurological: She is alert and oriented to person, place, and time. Coordination normal.  Skin: Skin is warm and dry. No rash noted. No erythema.  Psychiatric: She has a normal mood and affect. Her behavior is normal. Judgment and thought content normal.         Assessment and Plan

## 2012-05-07 NOTE — Assessment & Plan Note (Signed)
No significant change in 11 years with mild AI, minimal aortic valve stenosis. Bicuspid valve. Repeat echocardiogram in several years time.

## 2013-12-25 NOTE — L&D Delivery Note (Signed)
Delivery Note At 1:42 AM a viable and healthy female was delivered via  (Presentation: ROA ).  APGAR: 9, 9; weight pending   Placenta status: spontaneous and intact .  Cord:  with the following complications: loose [place] reduced.  Cord pH: na Mild shoulder dystocia relieved with Mc[place] and suprapubic pressure. [place] with 500cc due to atony. No lacerations. Bladder emptied. No placenta noted on uterine exploration. Cytotec and methergine given. Pt tolerated well. BP stable . VS stable.  Anesthesia: Local  Episiotomy: none Lacerations: first Suture Repair: 3.0 vicryl rapide Est. Blood Loss (mL): 500  Mom to postpartum.  Baby to Couplet care / Skin to Skin.  Beva Remund J 09/24/2014, 2:00

## 2014-02-18 LAB — OB RESULTS CONSOLE RUBELLA ANTIBODY, IGM: Rubella: IMMUNE

## 2014-02-18 LAB — OB RESULTS CONSOLE ABO/RH: RH TYPE: POSITIVE

## 2014-02-18 LAB — OB RESULTS CONSOLE HIV ANTIBODY (ROUTINE TESTING): HIV: NONREACTIVE

## 2014-02-18 LAB — OB RESULTS CONSOLE RPR: RPR: NONREACTIVE

## 2014-02-18 LAB — OB RESULTS CONSOLE ANTIBODY SCREEN: Antibody Screen: NEGATIVE

## 2014-02-18 LAB — OB RESULTS CONSOLE HEPATITIS B SURFACE ANTIGEN: HEP B S AG: NEGATIVE

## 2014-03-04 LAB — OB RESULTS CONSOLE GC/CHLAMYDIA
CHLAMYDIA, DNA PROBE: NEGATIVE
Gonorrhea: NEGATIVE

## 2014-07-02 ENCOUNTER — Other Ambulatory Visit (HOSPITAL_COMMUNITY): Payer: Self-pay | Admitting: *Deleted

## 2014-07-02 DIAGNOSIS — I359 Nonrheumatic aortic valve disorder, unspecified: Secondary | ICD-10-CM

## 2014-07-24 ENCOUNTER — Other Ambulatory Visit (INDEPENDENT_AMBULATORY_CARE_PROVIDER_SITE_OTHER): Payer: BC Managed Care – PPO

## 2014-07-24 ENCOUNTER — Other Ambulatory Visit: Payer: Self-pay

## 2014-07-24 DIAGNOSIS — I359 Nonrheumatic aortic valve disorder, unspecified: Secondary | ICD-10-CM

## 2014-09-03 LAB — OB RESULTS CONSOLE GBS: GBS: NEGATIVE

## 2014-09-23 ENCOUNTER — Inpatient Hospital Stay (HOSPITAL_COMMUNITY)
Admission: AD | Admit: 2014-09-23 | Discharge: 2014-09-25 | DRG: 774 | Disposition: A | Discharge status: Home / Self Care | Payer: BC Managed Care – PPO | Source: Ambulatory Visit | Attending: Obstetrics | Admitting: Obstetrics

## 2014-09-23 ENCOUNTER — Encounter (HOSPITAL_COMMUNITY): Payer: Self-pay

## 2014-09-23 DIAGNOSIS — Z87891 Personal history of nicotine dependence: Secondary | ICD-10-CM

## 2014-09-23 DIAGNOSIS — Z8249 Family history of ischemic heart disease and other diseases of the circulatory system: Secondary | ICD-10-CM

## 2014-09-23 DIAGNOSIS — Z3A39 39 weeks gestation of pregnancy: Secondary | ICD-10-CM | POA: Diagnosis present

## 2014-09-23 DIAGNOSIS — O9902 Anemia complicating childbirth: Secondary | ICD-10-CM | POA: Diagnosis present

## 2014-09-23 DIAGNOSIS — O99413 Diseases of the circulatory system complicating pregnancy, third trimester: Secondary | ICD-10-CM | POA: Diagnosis present

## 2014-09-23 DIAGNOSIS — D62 Acute posthemorrhagic anemia: Secondary | ICD-10-CM | POA: Diagnosis present

## 2014-09-23 DIAGNOSIS — I351 Nonrheumatic aortic (valve) insufficiency: Secondary | ICD-10-CM | POA: Diagnosis present

## 2014-09-23 DIAGNOSIS — Z823 Family history of stroke: Secondary | ICD-10-CM

## 2014-09-23 LAB — OB RESULTS CONSOLE GBS: GBS: NEGATIVE

## 2014-09-23 NOTE — MAU Note (Signed)
Pt reports uc's q 6 minutes. Pt reports some brown discharge. She states that her MD stripped her membranes in the office today.

## 2014-09-24 ENCOUNTER — Encounter (HOSPITAL_COMMUNITY): Payer: Self-pay

## 2014-09-24 DIAGNOSIS — I351 Nonrheumatic aortic (valve) insufficiency: Secondary | ICD-10-CM | POA: Diagnosis present

## 2014-09-24 DIAGNOSIS — Z3A39 39 weeks gestation of pregnancy: Secondary | ICD-10-CM | POA: Diagnosis present

## 2014-09-24 DIAGNOSIS — D62 Acute posthemorrhagic anemia: Secondary | ICD-10-CM | POA: Diagnosis present

## 2014-09-24 DIAGNOSIS — Z87891 Personal history of nicotine dependence: Secondary | ICD-10-CM | POA: Diagnosis not present

## 2014-09-24 DIAGNOSIS — Z823 Family history of stroke: Secondary | ICD-10-CM | POA: Diagnosis not present

## 2014-09-24 DIAGNOSIS — O9902 Anemia complicating childbirth: Secondary | ICD-10-CM | POA: Diagnosis present

## 2014-09-24 DIAGNOSIS — Z8249 Family history of ischemic heart disease and other diseases of the circulatory system: Secondary | ICD-10-CM | POA: Diagnosis not present

## 2014-09-24 LAB — RPR

## 2014-09-24 LAB — CBC
HCT: 34.1 % — ABNORMAL LOW (ref 36.0–46.0)
HEMATOCRIT: 28.7 % — AB (ref 36.0–46.0)
HEMOGLOBIN: 10 g/dL — AB (ref 12.0–15.0)
Hemoglobin: 11.7 g/dL — ABNORMAL LOW (ref 12.0–15.0)
MCH: 31 pg (ref 26.0–34.0)
MCH: 31.3 pg (ref 26.0–34.0)
MCHC: 34.3 g/dL (ref 30.0–36.0)
MCHC: 34.8 g/dL (ref 30.0–36.0)
MCV: 90 fL (ref 78.0–100.0)
MCV: 90.5 fL (ref 78.0–100.0)
PLATELETS: 248 10*3/uL (ref 150–400)
Platelets: 235 10*3/uL (ref 150–400)
RBC: 3.19 MIL/uL — AB (ref 3.87–5.11)
RBC: 3.77 MIL/uL — AB (ref 3.87–5.11)
RDW: 13.1 % (ref 11.5–15.5)
RDW: 13.3 % (ref 11.5–15.5)
WBC: 17.3 10*3/uL — AB (ref 4.0–10.5)
WBC: 25.8 10*3/uL — AB (ref 4.0–10.5)

## 2014-09-24 MED ORDER — LACTATED RINGERS IV SOLN
500.0000 mL | INTRAVENOUS | Status: DC | PRN
  Administered 2014-09-24: 500 mL via INTRAVENOUS

## 2014-09-24 MED ORDER — FLEET ENEMA 7-19 GM/118ML RE ENEM: 1.0000 | ENEMA | RECTAL | Status: DC | PRN

## 2014-09-24 MED ORDER — CITRIC ACID-SODIUM CITRATE 334-500 MG/5ML PO SOLN: 30.0000 mL | ORAL | Status: DC | PRN

## 2014-09-24 MED ORDER — WITCH HAZEL-GLYCERIN EX PADS: 1.0000 "application " | MEDICATED_PAD | CUTANEOUS | Status: DC | PRN

## 2014-09-24 MED ORDER — ONDANSETRON HCL 4 MG/2ML IJ SOLN: 4.0000 mg | INTRAMUSCULAR | Status: DC | PRN

## 2014-09-24 MED ORDER — OXYTOCIN 40 UNITS IN LACTATED RINGERS INFUSION - SIMPLE MED
62.5000 mL/h | INTRAVENOUS | Status: DC
  Administered 2014-09-24: 62.5 mL/h via INTRAVENOUS
  Filled 2014-09-24: qty 1000

## 2014-09-24 MED ORDER — DIBUCAINE 1 % RE OINT: 1.0000 "application " | TOPICAL_OINTMENT | RECTAL | Status: DC | PRN

## 2014-09-24 MED ORDER — LACTATED RINGERS IV SOLN
INTRAVENOUS | Status: DC
  Administered 2014-09-24: 01:00:00 via INTRAVENOUS

## 2014-09-24 MED ORDER — LIDOCAINE HCL (PF) 1 % IJ SOLN
30.0000 mL | INTRAMUSCULAR | Status: DC | PRN
  Administered 2014-09-24: 30 mL via SUBCUTANEOUS
  Filled 2014-09-24: qty 30

## 2014-09-24 MED ORDER — SIMETHICONE 80 MG PO CHEW: 80.0000 mg | CHEWABLE_TABLET | ORAL | Status: DC | PRN

## 2014-09-24 MED ORDER — DIPHENHYDRAMINE HCL 25 MG PO CAPS: 25.0000 mg | ORAL_CAPSULE | Freq: Four times a day (QID) | ORAL | Status: DC | PRN

## 2014-09-24 MED ORDER — TETANUS-DIPHTH-ACELL PERTUSSIS 5-2.5-18.5 LF-MCG/0.5 IM SUSP: 0.5000 mL | Freq: Once | INTRAMUSCULAR | Status: DC

## 2014-09-24 MED ORDER — OXYTOCIN BOLUS FROM INFUSION
500.0000 mL | INTRAVENOUS | Status: DC
  Administered 2014-09-24: 500 mL via INTRAVENOUS

## 2014-09-24 MED ORDER — METHYLERGONOVINE MALEATE 0.2 MG/ML IJ SOLN
0.2000 mg | INTRAMUSCULAR | Status: DC | PRN
  Administered 2014-09-24: 0.2 mg via INTRAMUSCULAR

## 2014-09-24 MED ORDER — ACETAMINOPHEN 325 MG PO TABS: 650.0000 mg | ORAL_TABLET | ORAL | Status: DC | PRN

## 2014-09-24 MED ORDER — ZOLPIDEM TARTRATE 5 MG PO TABS: 5.0000 mg | ORAL_TABLET | Freq: Every evening | ORAL | Status: DC | PRN

## 2014-09-24 MED ORDER — MISOPROSTOL 200 MCG PO TABS
800.0000 ug | ORAL_TABLET | Freq: Once | ORAL | Status: AC
  Administered 2014-09-24: 800 ug via RECTAL

## 2014-09-24 MED ORDER — ONDANSETRON HCL 4 MG PO TABS: 4.0000 mg | ORAL_TABLET | ORAL | Status: DC | PRN

## 2014-09-24 MED ORDER — METHYLERGONOVINE MALEATE 0.2 MG PO TABS
0.2000 mg | ORAL_TABLET | ORAL | Status: AC | PRN
  Administered 2014-09-24 – 2014-09-25 (×6): 0.2 mg via ORAL
  Filled 2014-09-24 (×6): qty 1

## 2014-09-24 MED ORDER — OXYCODONE-ACETAMINOPHEN 5-325 MG PO TABS: 2.0000 | ORAL_TABLET | ORAL | Status: DC | PRN

## 2014-09-24 MED ORDER — OXYCODONE-ACETAMINOPHEN 5-325 MG PO TABS
1.0000 | ORAL_TABLET | ORAL | Status: DC | PRN
Start: 2014-09-24 — End: 2014-09-25

## 2014-09-24 MED ORDER — ONDANSETRON HCL 4 MG/2ML IJ SOLN: 4.0000 mg | Freq: Four times a day (QID) | INTRAMUSCULAR | Status: DC | PRN

## 2014-09-24 MED ORDER — PRENATAL MULTIVITAMIN CH
1.0000 | ORAL_TABLET | Freq: Every day | ORAL | Status: DC
  Administered 2014-09-24 – 2014-09-25 (×2): 1 via ORAL
  Filled 2014-09-24 (×2): qty 1

## 2014-09-24 MED ORDER — SENNOSIDES-DOCUSATE SODIUM 8.6-50 MG PO TABS
2.0000 | ORAL_TABLET | ORAL | Status: DC
  Filled 2014-09-24: qty 2

## 2014-09-24 MED ORDER — LANOLIN HYDROUS EX OINT: TOPICAL_OINTMENT | CUTANEOUS | Status: DC | PRN

## 2014-09-24 MED ORDER — BENZOCAINE-MENTHOL 20-0.5 % EX AERO
1.0000 "application " | INHALATION_SPRAY | CUTANEOUS | Status: DC | PRN
  Administered 2014-09-25: 1 via TOPICAL
  Filled 2014-09-24: qty 56

## 2014-09-24 MED ORDER — MISOPROSTOL 200 MCG PO TABS
ORAL_TABLET | ORAL | Status: AC
  Administered 2014-09-24: 800 ug via RECTAL
  Filled 2014-09-24: qty 5

## 2014-09-24 MED ORDER — IBUPROFEN 600 MG PO TABS
600.0000 mg | ORAL_TABLET | Freq: Four times a day (QID) | ORAL | Status: DC
  Administered 2014-09-24 – 2014-09-25 (×6): 600 mg via ORAL
  Filled 2014-09-24 (×6): qty 1

## 2014-09-24 MED ORDER — METHYLERGONOVINE MALEATE 0.2 MG/ML IJ SOLN: 0.2000 mg | INTRAMUSCULAR | Status: AC | PRN

## 2014-09-24 MED ORDER — OXYCODONE-ACETAMINOPHEN 5-325 MG PO TABS: 1.0000 | ORAL_TABLET | ORAL | Status: DC | PRN

## 2014-09-24 NOTE — H&P (Signed)
Lindsey Juarez is a 33 y.o. female presenting for labor. Maternal Medical History:  Reason for admission: Contractions.   Contractions: Onset was less than 1 hour ago.   Frequency: regular.   Perceived severity is moderate.    Fetal activity: Perceived fetal activity is normal.   Last perceived fetal movement was within the past hour.    Prenatal complications: no prenatal complications Prenatal Complications - Diabetes: none.    OB History   Grav Para Term Preterm Abortions TAB SAB Ect Mult Living   2 1 1       1      Past Medical History  Diagnosis Date  . Aortic insufficiency     mild-moderate; suggests cogenital bicuspid aortic vlave; echo 10/09/01  . Abnormal Pap smear   . ASCUS on Pap smear   . Bicuspid aortic valve   . PP care - s/p NVB 4/12 04/06/2012   Past Surgical History  Procedure Laterality Date  . No past surgeries     Family History: family history includes Coronary artery disease in her maternal grandfather; Goiter in her father; Heart disease in her paternal grandfather; Hypertension in her father and mother; Stroke (age of onset: 20) in her maternal grandmother. There is no history of Anesthesia problems. Social History:  reports that she quit smoking about 15 years ago. She does not have any smokeless tobacco history on file. She reports that she does not drink alcohol or use illicit drugs.   Prenatal Transfer Tool  Maternal Diabetes: No Genetic Screening: Normal Maternal Ultrasounds/Referrals: Normal Fetal Ultrasounds or other Referrals:  None Maternal Substance Abuse:  No Significant Maternal Medications:  None Significant Maternal Lab Results:  None Other Comments:  None  Review of Systems  All other systems reviewed and are negative.   Dilation: 10 Effacement (%): 100 Station: 0 Exam by:: Dr. Edwyna Ready Blood pressure 113/65, pulse 86, temperature 98.3 F (36.8 C), temperature source Oral, resp. rate 18, height 5\' 2"  (1.575 m), weight 64.411  kg (142 lb). Maternal Exam:  Uterine Assessment: Contraction strength is moderate.  Contraction frequency is regular.   Abdomen: Patient reports no abdominal tenderness. Fetal presentation: vertex  Introitus: Normal vulva. Normal vagina.  Ferning test: not done.  Nitrazine test: not done. Amniotic fluid character: not assessed.  Pelvis: adequate for delivery.   Cervix: Cervix evaluated by digital exam.     Physical Exam  Constitutional: She is oriented to person, place, and time. She appears well-developed and well-nourished.  HENT:  Head: Normocephalic.  Cardiovascular: Normal rate and regular rhythm.   Respiratory: Effort normal and breath sounds normal.  GI: Soft. Bowel sounds are normal.  Genitourinary: Vagina normal and uterus normal.  Musculoskeletal: Normal range of motion.  Neurological: She is alert and oriented to person, place, and time.  Skin: Skin is warm and dry.  Psychiatric: She has a normal mood and affect.    Prenatal labs: ABO, Rh: A/Positive/-- (02/25 0000) Antibody: Negative (02/25 0000) Rubella: Immune (02/25 0000) RPR: Nonreactive (02/25 0000)  HBsAg: Negative (02/25 0000)  HIV: Non-reactive (02/25 0000)  GBS: Negative (09/30 0000)   Assessment/Plan: Active labor  ADmit   Amandajo Gonder J 09/24/2014, 1:36 AM

## 2014-09-24 NOTE — Lactation Note (Signed)
This note was copied from the chart of Lindsey Symone Cornman. Lactation Consultation Note  Patient Name: Lindsey Juarez [id number] Date: 09/24/2014 Reason for consult: Initial assessment Baby sleepy at this visit, demonstrated to Mom how to wake baby. Mom reports some mild tenderness on right nipple. Slight red, no breakdown noted. Advised Mom to apply EBM to sore nipples. Baxter assisted Mom with positioning and obtaining more depth with latch. Mom has been using cradle how and LC observed baby having difficulty with depth. Changed to cross cradle. Basic teaching reviewed with Mom. Lactation brochure left for review. Advised of OP services and support group. Encouraged to call if would like assist with latch or has questions/concerns.   Maternal Data Formula Feeding for Exclusion: No Has patient been taught Hand Expression?: Yes Does the patient have breastfeeding experience prior to this delivery?: Yes  Feeding Feeding Type: Breast Fed  LATCH Score/Interventions Latch: Repeated attempts needed to sustain latch, nipple held in mouth throughout feeding, stimulation needed to elicit sucking reflex. Intervention(s): Adjust position;Assist with latch;Breast massage;Breast compression  Audible Swallowing: A few with stimulation  Type of Nipple: Everted at rest and after stimulation (right breast everts w/stimulation)  Comfort (Breast/Nipple): Filling, red/small blisters or bruises, mild/mod discomfort  Problem noted: Mild/Moderate discomfort Interventions (Mild/moderate discomfort): Hand massage;Hand expression  Hold (Positioning): Assistance needed to correctly position infant at breast and maintain latch. Intervention(s): Breastfeeding basics reviewed;Support Pillows;Position options;Skin to skin  LATCH Score: 6  Lactation Tools Discussed/Used WIC Program: No   Consult Status Consult Status: Follow-up Date: 09/25/14 Follow-up type: In-patient    Lindsey Juarez 09/24/2014,  12:18 PM

## 2014-09-25 LAB — CBC
HCT: 26.4 % — ABNORMAL LOW (ref 36.0–46.0)
HEMOGLOBIN: 8.9 g/dL — AB (ref 12.0–15.0)
MCH: 31 pg (ref 26.0–34.0)
MCHC: 33.7 g/dL (ref 30.0–36.0)
MCV: 92 fL (ref 78.0–100.0)
Platelets: 220 10*3/uL (ref 150–400)
RBC: 2.87 MIL/uL — ABNORMAL LOW (ref 3.87–5.11)
RDW: 13.6 % (ref 11.5–15.5)
WBC: 14 10*3/uL — ABNORMAL HIGH (ref 4.0–10.5)

## 2014-09-25 MED ORDER — POLYSACCHARIDE IRON COMPLEX 150 MG PO CAPS: 150.0000 mg | ORAL_CAPSULE | Freq: Every day | ORAL | Status: DC

## 2014-09-25 MED ORDER — FUSION PLUS PO CAPS: 1.0000 | ORAL_CAPSULE | Freq: Two times a day (BID) | ORAL | Status: DC

## 2014-09-25 MED ORDER — IBUPROFEN 600 MG PO TABS
600.0000 mg | ORAL_TABLET | Freq: Four times a day (QID) | ORAL | Status: DC
Start: 2014-09-25 — End: 2016-01-20

## 2014-09-25 MED ORDER — MAGNESIUM OXIDE 400 (241.3 MG) MG PO TABS
200.0000 mg | ORAL_TABLET | Freq: Every day | ORAL | Status: DC
  Filled 2014-09-25 (×2): qty 0.5

## 2014-09-25 MED ORDER — POLYSACCHARIDE IRON COMPLEX 150 MG PO CAPS
150.0000 mg | ORAL_CAPSULE | Freq: Two times a day (BID) | ORAL | Status: DC
  Administered 2014-09-25: 150 mg via ORAL
  Filled 2014-09-25: qty 1

## 2014-09-25 NOTE — Discharge Instructions (Signed)
Breast Pumping Tips °If you are breastfeeding, there may be times when you cannot feed your baby directly. Returning to work or going on a trip are common examples. Pumping allows you to store breast milk and feed it to your baby later.  °You may not get much milk when you first start to pump. Your breasts should start to make more after a few days. If you pump at the times you usually feed your baby, you may be able to keep making enough milk to feed your baby without also using formula. The more often you pump, the more milk you will produce.  °WHEN SHOULD I PUMP?  °· You can begin to pump soon after delivery. However, some experts recommend waiting about 4 weeks before giving your infant a bottle to make sure breastfeeding is going well.  °· If you plan to return to work, begin pumping a few weeks before. This will help you develop techniques that work best for you. It also lets you build up a supply of breast milk.   °· When you are with your infant, feed on demand and pump after each feeding.   °· When you are away from your infant for several hours, pump for about 15 minutes every 2-3 hours. Pump both breasts at the same time if you can.   °· If your infant has a formula feeding, make sure to pump around the same time.     °· If you drink any alcohol, wait 2 hours before pumping.   °HOW DO I PREPARE TO PUMP? °Your let-down reflex is the natural reaction to stimulation that makes your breast milk flow. It is easier to stimulate this reflex when you are relaxed. Find relaxation techniques that work for you. If you have difficulty with your let-down reflex, try these methods:  °· Smell one of your infant's blankets or an item of clothing.   °· Look at a picture or video of your infant.   °· Sit in a quiet, private space.   °· Massage the breast you plan to pump.   °· Place soothing warmth on the breast.   °· Play relaxing music.   °WHAT ARE SOME GENERAL BREAST PUMPING TIPS? °· Wash your hands before you pump. You  do not need to wash your nipples or breasts. °· There are three ways to pump. °¨ You can use your hand to massage and compress your breast. °¨ You can use a handheld manual pump. °¨ You can use an electric pump.   °· Make sure the suction cup (flange) on the breast pump is the right size. Place the flange directly over the nipple. If it is the wrong size or placed the wrong way, it may be painful and cause nipple damage.   °· If pumping is uncomfortable, apply a small amount of purified or modified lanolin to your nipple and areola. °· If you are using an electric pump, adjust the speed and suction power to be more comfortable. °· If pumping is painful or if you are not getting very much milk, you may need a different type of pump. A lactation consultant can help you determine what type of pump to use.   °· Keep a full water bottle near you at all times. Drinking lots of fluid helps you make more milk.  °· You can store your milk to use later. Pumped breast milk can be stored in a sealable, sterile container or plastic bag. Label all stored breast milk with the date you pumped it. °¨ Milk can stay out at room temperature for up to 8 hours. °¨   You can store your milk in the refrigerator for up to 8 days. °¨ You can store your milk in the freezer for 3 months. Thaw frozen milk using warm water. Do not put it in the microwave. °· Do not smoke. Smoking can lower your milk supply and harm your infant. If you need help quitting, ask your health care provider to recommend a program.   °WHEN SHOULD I CALL MY HEALTH CARE PROVIDER OR A LACTATION CONSULTANT? °· You are having trouble pumping. °· You are concerned that you are not making enough milk. °· You have nipple pain, soreness, or redness. °· You want to use birth control. Birth control pills may lower your milk supply. Talk to your health care provider about your options. °Document Released: 05/31/2010 Document Revised: 12/16/2013 Document Reviewed:  10/03/2013 °Exit[employer]® Patient Information ©2015 [employer]. This information is not intended to replace advice given to you by your health care provider. Make sure you discuss any questions you have with your health care provider. ° °Nutrition for the New Mother  °A new mother needs good health and nutrition so she can have energy to take care of a new baby. Whether a mother breastfeeds or formula feeds the baby, it is important to have a well-balanced diet. Foods from all the food groups should be chosen to meet the new mother's energy needs and to give her the nutrients needed for repair and healing.  °A HEALTHY EATING PLAN °The My [employer] plan for Moms outlines what you should eat to help you and your baby stay healthy. The energy and amount of food you need depends on whether or not you are breastfeeding. If you are breastfeeding you will need more nutrients. If you choose not to breastfeed, your nutrition goal should be to return to a healthy weight. Limiting calories may be needed if you are not breastfeeding.  °HOME CARE INSTRUCTIONS  °· For a personal plan based on your unique needs, see your Registered Dietitian or visit www.mypyramid.gov. °· Eat a variety of foods. The plan below will help guide you. The following chart has a suggested daily meal plan from the [employer] for Moms. °· Eat a variety of fruits and vegetables. °· Eat more dark green and orange vegetables and cooked dried beans. °· Make half your grains whole grains. Choose whole instead of refined grains. °· Choose low-fat or lean meats and poultry. °· Choose low-fat or fat-free dairy products like milk, cheese, or yogurt. °Fruits °· Breastfeeding: 2 cups °· Non-Breastfeeding: 2 cups °· What Counts as a serving? °¨ 1 cup of fruit or juice. °¨ ½ cup dried fruit. °Vegetables °· Breastfeeding: 3 cups °· Non-Breastfeeding: 2 ½ cups °· What Counts as a serving? °¨ 1 cup raw or cooked vegetables. °¨ Juice or 2 cups raw leafy  vegetables. °Grains °· Breastfeeding: 8 oz °· Non-Breastfeeding: 6 oz °· What Counts as a serving? °¨ 1 slice bread. °¨ 1 oz ready-to-eat cereal. °¨ ½ cup cooked pasta, rice, or cereal. °Meat and Beans °· Breastfeeding: 6 ½ oz °· Non-Breastfeeding: 5 ½ oz °· What Counts as a serving? °¨ 1 oz lean meat, poultry, or fish °¨ ¼ cup cooked dry beans °¨ ½ oz nuts or 1 egg °¨ 1 tbs peanut butter °Milk °· Breastfeeding: 3 cups °· Non-Breastfeeding: 3 cups °· What Counts as a serving? °¨ 1 cup milk. °¨ 8 oz yogurt. °¨ 1 ½ oz cheese. °¨ 2 oz processed cheese. °TIPS FOR THE BREASTFEEDING MOM °· Rapid weight   loss is not suggested when you are breastfeeding. By simply breastfeeding, you will be able to lose the weight gained during your pregnancy. Your caregiver can keep track of your weight and tell you if your weight loss is appropriate. °· Be sure to drink fluids. You may notice that you are thirstier than usual. A suggestion is to drink a glass of water or other beverage whenever you breastfeed. °· Avoid alcohol as it can be passed into your breast milk. °· Limit caffeine drinks to no more than 2 to 3 cups per day. °· You may need to keep taking your prenatal vitamin while you are breastfeeding. Talk with your caregiver about taking a vitamin or supplement. °RETURING TO A HEALTHY WEIGHT °· The My [employer] Plan for Moms will help you return to a healthy weight. It will also provide the nutrients you need. °· You may need to limit "empty" calories. These include: °¨ High fat foods like fried foods, fatty meats, fast food, butter, and mayonnaise. °¨ High sugar foods like sodas, jelly, candy, and sweets. °· Be physically active. Include 30 minutes of exercise or more each day. Choose an activity you like such as walking, swimming, biking, or aerobics. Check with your caregiver before you start to exercise. °Document Released: 03/19/2008 Document Revised: 03/04/2012 Document Reviewed: 03/19/2008 °ExitCare® Patient Information  ©2015 [employer]. This information is not intended to replace advice given to you by your health care provider. Make sure you discuss any questions you have with your health care provider. °Postpartum Depression and Baby Blues °The postpartum period begins right after the birth of a baby. During this time, there is often a great amount of joy and excitement. It is also a time of many changes in the life of the parents. Regardless of how many times a mother gives birth, each child brings new challenges and dynamics to the family. It is not unusual to have feelings of excitement along with confusing shifts in moods, emotions, and thoughts. All mothers are at risk of developing postpartum depression or the "baby blues." These mood changes can occur right after giving birth, or they may occur many months after giving birth. The baby blues or postpartum depression can be mild or severe. Additionally, postpartum depression can go away rather quickly, or it can be a long-term condition.  °CAUSES °Raised hormone levels and the rapid drop in those levels are thought to be a main cause of postpartum depression and the baby blues. A number of hormones change during and after pregnancy. Estrogen and progesterone usually decrease right after the delivery of your baby. The levels of thyroid hormone and various cortisol steroids also rapidly drop. Other factors that play a role in these mood changes include major life events and genetics.  °RISK FACTORS °If you have any of the following risks for the baby blues or postpartum depression, know what symptoms to watch out for during the postpartum period. Risk factors that may increase the likelihood of getting the baby blues or postpartum depression include: °· Having a personal or family history of depression.   °· Having depression while being pregnant.   °· Having premenstrual mood issues or mood issues related to oral contraceptives. °· Having a lot of life stress.   °· Having  marital conflict.   °· Lacking a social support network.   °· Having a baby with special needs.   °· Having health problems, such as diabetes.   °SIGNS AND SYMPTOMS °Symptoms of baby blues include: °· Brief changes in mood, such as going   from extreme happiness to sadness. °· Decreased concentration.   °· Difficulty sleeping.   °· Crying spells, tearfulness.   °· Irritability.   °· Anxiety.   °Symptoms of postpartum depression typically begin within the first month after giving birth. These symptoms include: °· Difficulty sleeping or excessive sleepiness.   °· Marked weight loss.   °· Agitation.   °· Feelings of worthlessness.   °· Lack of interest in activity or food.   °Postpartum psychosis is a very serious condition and can be dangerous. Fortunately, it is rare. Displaying any of the following symptoms is cause for immediate medical attention. Symptoms of postpartum psychosis include:  °· Hallucinations and delusions.   °· Bizarre or disorganized behavior.   °· Confusion or disorientation.   °DIAGNOSIS  °A diagnosis is made by an evaluation of your symptoms. There are no medical or lab tests that lead to a diagnosis, but there are various questionnaires that a health care provider may use to identify those with the baby blues, postpartum depression, or psychosis. Often, a screening tool called the Edinburgh Postnatal Depression Scale is used to diagnose depression in the postpartum period.  °TREATMENT °The baby blues usually goes away on its own in 1-2 weeks. Social support is often all that is needed. You will be encouraged to get adequate sleep and rest. Occasionally, you may be given medicines to help you sleep.  °Postpartum depression requires treatment because it can last several months or longer if it is not treated. Treatment may include individual or group therapy, medicine, or both to address any social, physiological, and psychological factors that may play a role in the depression. Regular exercise, a  healthy diet, rest, and social support may also be strongly recommended.  °Postpartum psychosis is more serious and needs treatment right away. Hospitalization is often needed. °HOME CARE INSTRUCTIONS °· Get as much rest as you can. Nap when the baby sleeps.   °· Exercise regularly. Some women find yoga and walking to be beneficial.   °· Eat a balanced and nourishing diet.   °· Do little things that you enjoy. Have a cup of tea, take a bubble bath, read your favorite magazine, or listen to your favorite music. °· Avoid alcohol.   °· Ask for help with household chores, cooking, grocery shopping, or running errands as needed. Do not try to do everything.   °· Talk to people close to you about how you are feeling. Get support from your partner, family members, friends, or other new moms. °· Try to stay positive in how you think. Think about the things you are grateful for.   °· Do not spend a lot of time alone.   °· Only take over-the-counter or prescription medicine as directed by your health care provider. °· Keep all your postpartum appointments.   °· Let your health care provider know if you have any concerns.   °SEEK MEDICAL CARE IF: °You are having a reaction to or problems with your medicine. °SEEK IMMEDIATE MEDICAL CARE IF: °· You have suicidal feelings.   °· You think you may harm the baby or someone else. °MAKE SURE YOU: °· Understand these instructions. °· Will watch your condition. °· Will get help right away if you are not doing well or get worse. °Document Released: 09/14/2004 Document Revised: 12/16/2013 Document Reviewed: 09/22/2013 °Exit[employer]® Patient Information ©2015 [employer]. This information is not intended to replace advice given to you by your health care provider. Make sure you discuss any questions you have with your health care provider. °Breastfeeding and Mastitis °Mastitis is inflammation of the breast tissue. It can occur in women who   are breastfeeding. This can make breastfeeding  painful. Mastitis will sometimes go away on its own. Your health care provider will help determine if treatment is needed. °CAUSES °Mastitis is often associated with a blocked milk (lactiferous) duct. This can happen when too much milk builds up in the breast. Causes of excess milk in the breast can include: °· Poor latch-on. If your baby is not latched onto the breast properly, she or he may not empty your breast completely while breastfeeding. °· Allowing too much time to pass between feedings. °· Wearing a bra or other clothing that is too tight. This puts extra pressure on the lactiferous ducts so milk does not flow through them as it should. °Mastitis can also be caused by a bacterial infection. Bacteria may enter the breast tissue through cuts or openings in the skin. In women who are breastfeeding, this may occur because of cracked or irritated skin. Cracks in the skin are often caused when your baby does not latch on properly to the breast. °SIGNS AND SYMPTOMS °· Swelling, redness, tenderness, and pain in an area of the breast. °· Swelling of the glands under the arm on the same side. °· Fever may or may not accompany mastitis. °If an infection is allowed to progress, a collection of pus (abscess) may develop. °DIAGNOSIS  °Your health care provider can usually diagnose mastitis based on your symptoms and a physical exam. Tests may be done to help confirm the diagnosis. These may include: °· Removal of pus from the breast by applying pressure to the area. This pus can be examined in the lab to determine which bacteria are present. If an abscess has developed, the fluid in the abscess can be removed with a needle. This can also be used to confirm the diagnosis and determine the bacteria present. In most cases, pus will not be present. °· Blood tests to determine if your body is fighting a bacterial infection. °· Mammogram or ultrasound tests to rule out other problems or diseases. °TREATMENT  °Mastitis that  occurs with breastfeeding will sometimes go away on its own. Your health care provider may choose to wait 24 hours after first seeing you to decide whether a prescription medicine is needed. If your symptoms are worse after 24 hours, your health care provider will likely prescribe an antibiotic medicine to treat the mastitis. He or she will determine which bacteria are most likely causing the infection and will then select an appropriate antibiotic medicine. This is sometimes changed based on the results of tests performed to identify the bacteria, or if there is no response to the antibiotic medicine selected. Antibiotic medicines are usually given by mouth. You may also be given medicine for pain. °HOME CARE INSTRUCTIONS °· Only take over-the-counter or prescription medicines for pain, fever, or discomfort as directed by your health care provider. °· If your health care provider prescribed an antibiotic medicine, take the medicine as directed. Make sure you finish it even if you start to feel better. °· Do not wear a tight or underwire bra. Wear a soft, supportive bra. °· Increase your fluid intake, especially if you have a fever. °· Continue to empty the breast. Your health care provider can tell you whether this milk is safe for your infant or needs to be thrown out. You may be told to stop nursing until your health care provider thinks it is safe for your baby. Use a breast pump if you are advised to stop nursing. °· Keep your nipples   clean and dry. °· Empty the first breast completely before going to the other breast. If your baby is not emptying your breasts completely for some reason, use a breast pump to empty your breasts. °· If you go back to work, pump your breasts while at work to stay in time with your nursing schedule. °· Avoid allowing your breasts to become overly filled with milk (engorged). °SEEK MEDICAL CARE IF: °· You have pus-like discharge from the breast. °· Your symptoms do not improve with  the treatment prescribed by your health care provider within 2 days. °SEEK IMMEDIATE MEDICAL CARE IF: °· Your pain and swelling are getting worse. °· You have pain that is not controlled with medicine. °· You have a red line extending from the breast toward your armpit. °· You have a fever or persistent symptoms for more than 2-3 days. °· You have a fever and your symptoms suddenly get worse. °MAKE SURE YOU:  °· Understand these instructions. °· Will watch your condition. °· Will get help right away if you are not doing well or get worse. °Document Released: 04/07/2005 Document Revised: 12/16/2013 Document Reviewed: 07/17/2013 °[employer]® Patient Information ©2015 [employer]. This information is not intended to replace advice given to you by your health care provider. Make sure you discuss any questions you have with your health care provider. °Breastfeeding °Deciding to breastfeed is one of the best choices you can make for you and your baby. A change in hormones during pregnancy causes your breast tissue to grow and increases the number and size of your milk ducts. These hormones also allow proteins, sugars, and fats from your blood supply to make breast milk in your milk-producing glands. Hormones prevent breast milk from being released before your baby is born as well as prompt milk flow after birth. Once breastfeeding has begun, thoughts of your baby, as well as his or her sucking or crying, can stimulate the release of milk from your milk-producing glands.  °BENEFITS OF BREASTFEEDING °For Your Baby °· Your first milk (colostrum) helps your baby's digestive system function better.   °· There are antibodies in your milk that help your baby fight off infections.   °· Your baby has a lower incidence of asthma, allergies, and sudden infant death syndrome.   °· The nutrients in breast milk are better for your baby than infant formulas and are designed uniquely for your baby's needs.   °· Breast milk improves your  baby's brain development.   °· Your baby is less likely to develop other conditions, such as childhood obesity, asthma, or type 2 diabetes mellitus.   °For You  °· Breastfeeding helps to create a very special bond between you and your baby.   °· Breastfeeding is convenient. Breast milk is always available at the correct temperature and costs nothing.   °· Breastfeeding helps to burn calories and helps you lose the weight gained during pregnancy.   °· Breastfeeding makes your uterus contract to its prepregnancy size faster and slows bleeding (lochia) after you give birth.   °· Breastfeeding helps to lower your risk of developing type 2 diabetes mellitus, osteoporosis, and breast or ovarian cancer later in life. °SIGNS THAT YOUR BABY IS HUNGRY °Early Signs of Hunger  °· Increased alertness or activity. °· Stretching. °· Movement of the head from side to side. °· Movement of the head and opening of the mouth when the corner of the mouth or cheek is stroked (rooting). °· Increased sucking sounds, smacking lips, cooing, sighing, or squeaking. °· Hand-to-mouth movements. °· Increased sucking of   fingers or hands. °Late Signs of Hunger °· Fussing. °· Intermittent crying. °Extreme Signs of Hunger °Signs of extreme hunger will require calming and consoling before your baby will be able to breastfeed successfully. Do not wait for the following signs of extreme hunger to occur before you initiate breastfeeding:   °· Restlessness. °· A loud, strong cry. °·  Screaming. °BREASTFEEDING BASICS °Breastfeeding Initiation °· Find a comfortable place to sit or lie down, with your neck and back well supported. °· Place a pillow or rolled up blanket under your baby to bring him or her to the level of your breast (if you are seated). Nursing pillows are specially designed to help support your arms and your baby while you breastfeed. °· Make sure that your baby's abdomen is facing your abdomen.   °· Gently massage your breast. With your  fingertips, massage from your chest wall toward your nipple in a circular motion. This encourages milk flow. You may need to continue this action during the feeding if your milk flows slowly. °· Support your breast with 4 fingers underneath and your thumb above your nipple. Make sure your fingers are well away from your nipple and your baby's mouth.   °· Stroke your baby's lips gently with your finger or nipple.   °· When your baby's mouth is open wide enough, quickly bring your baby to your breast, placing your entire nipple and as much of the colored area around your nipple (areola) as possible into your baby's mouth.   °¨ More areola should be visible above your baby's upper lip than below the lower lip.   °¨ Your baby's tongue should be between his or her lower gum and your breast.   °· Ensure that your baby's mouth is correctly positioned around your nipple (latched). Your baby's lips should create a seal on your breast and be turned out (everted). °· It is common for your baby to suck about 2-3 minutes in order to start the flow of breast milk. °Latching °Teaching your baby how to latch on to your breast properly is very important. An improper latch can cause nipple pain and decreased milk supply for you and poor weight gain in your baby. Also, if your baby is not latched onto your nipple properly, he or she may swallow some air during feeding. This can make your baby fussy. Burping your baby when you switch breasts during the feeding can help to get rid of the air. However, teaching your baby to latch on properly is still the best way to prevent fussiness from swallowing air while breastfeeding. °Signs that your baby has successfully latched on to your nipple:    °· Silent tugging or silent sucking, without causing you pain.   °· Swallowing heard between every 3-4 sucks.   °·  Muscle movement above and in front of his or her ears while sucking.   °Signs that your baby has not successfully latched on to  nipple:  °· Sucking sounds or smacking sounds from your baby while breastfeeding. °· Nipple pain. °If you think your baby has not latched on correctly, slip your finger into the corner of your baby's mouth to break the suction and place it between your baby's gums. Attempt breastfeeding initiation again. °Signs of Successful Breastfeeding °Signs from your baby:   °· A gradual decrease in the number of sucks or complete cessation of sucking.   °· Falling asleep.   °· Relaxation of his or her body.   °· Retention of a small amount of milk in his or her mouth.   °· Letting go   of your breast by himself or herself. °Signs from you: °· Breasts that have increased in firmness, weight, and size 1-3 hours after feeding.   °· Breasts that are softer immediately after breastfeeding. °· Increased milk volume, as well as a change in milk consistency and color by the fifth day of breastfeeding.   °· Nipples that are not sore, cracked, or bleeding. °Signs That Your Baby is Getting Enough Milk °· Wetting at least 3 diapers in a 24-hour period. The urine should be clear and pale yellow by age 5 days. °· At least 3 stools in a 24-hour period by age 5 days. The stool should be soft and yellow. °· At least 3 stools in a 24-hour period by age 7 days. The stool should be seedy and yellow. °· No loss of weight greater than 10% of birth weight during the first 3 days of age. °· Average weight gain of 4-7 ounces (113-198 g) per week after age 4 days. °· Consistent daily weight gain by age 5 days, without weight loss after the age of 2 weeks. °After a feeding, your baby may spit up a small amount. This is common. °BREASTFEEDING FREQUENCY AND DURATION °Frequent feeding will help you make more milk and can prevent sore nipples and breast engorgement. Breastfeed when you feel the need to reduce the fullness of your breasts or when your baby shows signs of hunger. This is called "breastfeeding on demand." Avoid introducing a pacifier to your  baby while you are working to establish breastfeeding (the first 4-6 weeks after your baby is born). After this time you may choose to use a pacifier. Research has shown that pacifier use during the first year of a baby's life decreases the risk of sudden infant death syndrome (SIDS). °Allow your baby to feed on each breast as long as he or she wants. Breastfeed until your baby is finished feeding. When your baby unlatches or falls asleep while feeding from the first breast, offer the second breast. Because newborns are often sleepy in the first few weeks of life, you may need to awaken your baby to get him or her to feed. °Breastfeeding times will vary from baby to baby. However, the following rules can serve as a guide to help you ensure that your baby is properly fed: °· Newborns (babies 4 weeks of age or younger) may breastfeed every 1-3 hours. °· Newborns should not go longer than 3 hours during the day or 5 hours during the night without breastfeeding. °· You should breastfeed your baby a minimum of 8 times in a 24-hour period until you begin to introduce solid foods to your baby at around 6 months of age. °BREAST MILK PUMPING °Pumping and storing breast milk allows you to ensure that your baby is exclusively fed your breast milk, even at times when you are unable to breastfeed. This is especially important if you are going back to work while you are still breastfeeding or when you are not able to be present during feedings. Your lactation consultant can give you guidelines on how long it is safe to store breast milk.  °A breast pump is a machine that allows you to pump milk from your breast into a sterile bottle. The pumped breast milk can then be stored in a refrigerator or freezer. Some breast pumps are operated by hand, while others use electricity. Ask your lactation consultant which type will work best for you. Breast pumps can be purchased, but some hospitals and breastfeeding support groups   lease  breast pumps on a monthly basis. A lactation consultant can teach you how to hand express breast milk, if you prefer not to use a pump.  °CARING FOR YOUR BREASTS WHILE YOU BREASTFEED °Nipples can become dry, cracked, and sore while breastfeeding. The following recommendations can help keep your breasts moisturized and healthy: °· Avoid using soap on your nipples.   °· Wear a supportive bra. Although not required, special nursing bras and tank tops are designed to allow access to your breasts for breastfeeding without taking off your entire bra or top. Avoid wearing underwire-style bras or extremely tight bras. °· Air dry your nipples for 3-4 minutes after each feeding.   °· Use only cotton bra pads to absorb leaked breast milk. Leaking of breast milk between feedings is normal.   °· Use lanolin on your nipples after breastfeeding. Lanolin helps to maintain your skin's normal moisture barrier. If you use pure lanolin, you do not need to wash it off before feeding your baby again. Pure lanolin is not toxic to your baby. You may also hand express a few drops of breast milk and gently massage that milk into your nipples and allow the milk to air dry. °In the first few weeks after giving birth, some women experience extremely full breasts (engorgement). Engorgement can make your breasts feel heavy, warm, and tender to the touch. Engorgement peaks within 3-5 days after you give birth. The following recommendations can help ease engorgement: °· Completely empty your breasts while breastfeeding or pumping. You may want to start by applying warm, moist heat (in the shower or with warm water-soaked hand towels) just before feeding or pumping. This increases circulation and helps the milk flow. If your baby does not completely empty your breasts while breastfeeding, pump any extra milk after he or she is finished. °· Wear a snug bra (nursing or regular) or tank top for 1-2 days to signal your body to slightly decrease milk  production. °· Apply ice packs to your breasts, unless this is too uncomfortable for you. °· Make sure that your baby is latched on and positioned properly while breastfeeding. °If engorgement persists after 48 hours of following these recommendations, contact your health care provider or a lactation consultant. °OVERALL HEALTH CARE RECOMMENDATIONS WHILE BREASTFEEDING °· Eat healthy foods. Alternate between meals and snacks, eating 3 of each per day. Because what you eat affects your breast milk, some of the foods may make your baby more irritable than usual. Avoid eating these foods if you are sure that they are negatively affecting your baby. °· Drink milk, fruit juice, and water to satisfy your thirst (about 10 glasses a day).   °· Rest often, relax, and continue to take your prenatal vitamins to prevent fatigue, stress, and anemia. °· Continue breast self-awareness checks. °· Avoid chewing and smoking tobacco. °· Avoid alcohol and drug use. °Some medicines that may be harmful to your baby can pass through breast milk. It is important to ask your health care provider before taking any medicine, including all over-the-counter and prescription medicine as well as vitamin and herbal supplements. °It is possible to become pregnant while breastfeeding. If birth control is desired, ask your health care provider about options that will be safe for your baby. °SEEK MEDICAL CARE IF:  °· You feel like you want to stop breastfeeding or have become frustrated with breastfeeding. °· You have painful breasts or nipples. °· Your nipples are cracked or bleeding. °· Your breasts are red, tender, or warm. °· You have   a swollen area on either breast. °· You have a fever or chills. °· You have nausea or vomiting. °· You have drainage other than breast milk from your nipples. °· Your breasts do not become full before feedings by the fifth day after you give birth. °· You feel sad and depressed. °· Your baby is too sleepy to eat  well. °· Your baby is having trouble sleeping.   °· Your baby is wetting less than 3 diapers in a 24-hour period. °· Your baby has less than 3 stools in a 24-hour period. °· Your baby's skin or the white part of his or her eyes becomes yellow.   °· Your baby is not gaining weight by 5 days of age. °SEEK IMMEDIATE MEDICAL CARE IF:  °· Your baby is overly tired (lethargic) and does not want to wake up and feed. °· Your baby develops an unexplained fever. °Document Released: 12/11/2005 Document Revised: 12/16/2013 Document Reviewed: 06/04/2013 °[employer]® Patient Information ©2015 [employer]. This information is not intended to replace advice given to you by your health care provider. Make sure you discuss any questions you have with your health care provider. ° °

## 2014-09-25 NOTE — Progress Notes (Signed)
PPD # 1 SVD  S:  Reports feeling well, ready for early discharge             Tolerating po/ No nausea or vomiting             Bleeding is light             Pain controlled with ibuprofen (OTC)             Up ad lib / ambulatory / voiding without difficulties    Newborn  Information for the patient's newborn:  Shamiya, Demeritt [[id number]]  female  breast feeding  / Circumcision done   O:  A & O x 3, in no apparent distress              VS:  Filed Vitals:   09/24/14 0545 09/24/14 0930 09/24/14 1810 09/25/14 0540  BP: 105/64 102/61 101/54 91/58  Pulse: 92 100 85 87  Temp: 98.9 F (37.2 C) 98.3 F (36.8 C) 98.3 F (36.8 C) 97.7 F (36.5 C)  TempSrc: Oral Oral Oral Oral  Resp: 18 18 18 18   Height:      Weight:      SpO2:   97%     LABS:  Recent Labs  09/24/14 0615 09/25/14 0635  WBC 25.8* 14.0*  HGB 10.0* 8.9*  HCT 28.7* 26.4*  PLT 235 220    Blood type: A/Positive/-- (02/25 0000)  Rubella: Immune (02/25 0000)   I&O: I/O last 3 completed shifts: In: -  Out: 500 [Blood:500]             Lungs: Clear and unlabored  Heart: regular rate and rhythm / no murmurs  Abdomen: soft, non-tender, non-distended              Fundus: firm, non-tender, U-2  Perineum: 1st degree repair healing well  Lochia: minimal  Extremities: no edema, no calf pain or tenderness, no Homans    A/P: PPD # 1  33 y.o., [id number]   Principal Problem:    Postpartum care following vaginal delivery (10/1) with 1st degree laceration    Postpartum hemorrhage  ABL Anemia    Doing well - stable status  Routine post partum orders  Desires early discharge today  Bobetta Korf, M, MSN, CNM 09/25/2014, 1:15 PM

## 2014-09-25 NOTE — Lactation Note (Signed)
This note was copied from the chart of Lindsey Jendaya Gossett. Lactation Consultation Note  Patient Name: Lindsey Juarez [id number] Date: 09/25/2014 Reason for consult: Follow-up assessment Baby 32 hours of life. Mom reports nursing going very well. Mom is experienced BF. Denies nipple pain. Reviewed engorgement prevention/treatment. Mom aware of OP/BFSG and [place] phone line assistance. Referred mom to Baby and Me booklet for number of diapers to expect and EBM storage guidelines.   Maternal Data    Feeding    LATCH Score/Interventions                      Lactation Tools Discussed/Used     Consult Status Consult Status: Complete    [name] 09/25/2014, 10:25 AM

## 2014-09-25 NOTE — Discharge Summary (Signed)
Obstetric Discharge Summary  Reason for Admission: Pt is a G2P2002 at [redacted]w[redacted]d in active labor and advanced dilation  Patient has received care at [facility] since 7.6 wks, with Dr. Pamala Hurry as primary provider.  Medications on Admission: No prescriptions prior to admission    Prenatal Labs: ABO, Rh: A/Positive/-- (02/25 0000)  Antibody: Negative (02/25 0000) Rubella: Immune (02/25 0000)   RPR: NON REAC (10/01 0025)  HBsAg: Negative (02/25 0000)  HIV: Non-reactive (02/25 0000)  GTT : abnormal GBS: Negative (09/30 0000)   Prenatal Procedures: NST and ultrasound Intrapartum Procedures: spontaneous vaginal delivery Postpartum Procedures: none Complications-Operative and Postpartum: 1st degree perineal laceration / Mild Shoulder Dystocia / PPH  Intrapartum Course: Admitted in active labor at complete dilation / rapid delivery of viable female infant with a mild shoulder dystocia / 1st degree repair by Dr. Ronita Hipps / [place] with EBL of 500 mL treated with Cytotec and Methergine  Labs: Hemoglobin  Date Value Ref Range Status  09/25/2014 8.9* 12.0 - 15.0 g/dL Final     HCT  Date Value Ref Range Status  09/25/2014 26.4* 36.0 - 46.0 % Final   Lab Results  Component Value Date   PLT 220 09/25/2014    Newborn Data: Live born female  Birth Weight: 7 lb 1.9 oz (3230 g) APGAR: 9, 9  Home with mother.   Discharge Information: Date: 09/26/2014 Discharge Diagnoses:  Pt is a G2P2002 at [redacted]w[redacted]d S/P Term Pregnancy-delivered on 09/24/2014  Condition: stable Activity: pelvic rest Diet: routine Medications:    Medication List         FUSION PLUS Caps  Take 1 capsule by mouth 2 (two) times daily.     ibuprofen 600 MG tablet  Commonly known as:  ADVIL,MOTRIN  Take 1 tablet (600 mg total) by mouth every 6 (six) hours.     prenatal multivitamin Tabs tablet  Take 1 tablet by mouth daily at 12 noon.       Instructions: [facility] instruction booklet has been given and  reviewed Discharge to: home     Follow-up Information   Follow up with [facility] MD. Schedule an appointment as soon as possible for a visit in 6 weeks. (postpartum visit)    Specialty:  Obstetrics and Gynecology   Contact information:   [address] [place] [address] [phone]       Laury Deep, Jerilynn Mages, MSN, CNM 09/25/2014, 1:22 PM

## 2014-10-26 ENCOUNTER — Encounter (HOSPITAL_COMMUNITY): Payer: Self-pay

## 2016-01-20 ENCOUNTER — Ambulatory Visit (INDEPENDENT_AMBULATORY_CARE_PROVIDER_SITE_OTHER): Payer: BLUE CROSS/BLUE SHIELD | Admitting: Family Medicine

## 2016-01-20 ENCOUNTER — Encounter: Payer: Self-pay | Admitting: Family Medicine

## 2016-01-20 VITALS — BP 112/62 | HR 98 | Temp 97.7°F | Ht 62.0 in | Wt 110.5 lb

## 2016-01-20 DIAGNOSIS — H6690 Otitis media, unspecified, unspecified ear: Secondary | ICD-10-CM | POA: Insufficient documentation

## 2016-01-20 DIAGNOSIS — H9221 Otorrhagia, right ear: Secondary | ICD-10-CM

## 2016-01-20 DIAGNOSIS — H9220 Otorrhagia, unspecified ear: Secondary | ICD-10-CM | POA: Insufficient documentation

## 2016-01-20 DIAGNOSIS — H6691 Otitis media, unspecified, right ear: Secondary | ICD-10-CM

## 2016-01-20 MED ORDER — CEFDINIR 300 MG PO CAPS: 600.0000 mg | ORAL_CAPSULE | Freq: Every day | ORAL | Status: DC

## 2016-01-20 NOTE — Assessment & Plan Note (Signed)
New problem. Treating with Omnicef. Could not appreciate perforation but history and exam concerning. Sending to ENT for further eval.

## 2016-01-20 NOTE — Patient Instructions (Signed)
Take the medication as prescribed.  We will have you see ENT to ensure no perforation/other issues.  Take care  Dr. Lacinda Axon   Health Maintenance, Female Adopting a healthy lifestyle and getting preventive care can go a long way to promote health and wellness. Talk with your health care provider about what schedule of regular examinations is right for you. This is a good chance for you to check in with your provider about disease prevention and staying healthy. In between checkups, there are plenty of things you can do on your own. Experts have done a lot of research about which lifestyle changes and preventive measures are most likely to keep you healthy. Ask your health care provider for more information. WEIGHT AND DIET  Eat a healthy diet  Be sure to include plenty of vegetables, fruits, low-fat dairy products, and lean protein.  Do not eat a lot of foods high in solid fats, added sugars, or salt.  Get regular exercise. This is one of the most important things you can do for your health.  Most adults should exercise for at least 150 minutes each week. The exercise should increase your heart rate and make you sweat (moderate-intensity exercise).  Most adults should also do strengthening exercises at least twice a week. This is in addition to the moderate-intensity exercise.  Maintain a healthy weight  Body mass index (BMI) is a measurement that can be used to identify possible weight problems. It estimates body fat based on height and weight. Your health care provider can help determine your BMI and help you achieve or maintain a healthy weight.  For females 63 years of age and older:   A BMI below 18.5 is considered underweight.  A BMI of 18.5 to 24.9 is normal.  A BMI of 25 to 29.9 is considered overweight.  A BMI of 30 and above is considered obese.  Watch levels of cholesterol and blood lipids  You should start having your blood tested for lipids and cholesterol at 37  years of age, then have this test every 5 years.  You may need to have your cholesterol levels checked more often if:  Your lipid or cholesterol levels are high.  You are older than 37 years of age.  You are at high risk for heart disease.  CANCER SCREENING   Lung Cancer  Lung cancer screening is recommended for adults 37-13 years old who are at high risk for lung cancer because of a history of smoking.  A yearly low-dose CT scan of the lungs is recommended for people who:  Currently smoke.  Have quit within the past 15 years.  Have at least a 30-pack-year history of smoking. A pack year is smoking an average of one pack of cigarettes a day for 1 year.  Yearly screening should continue until it has been 15 years since you quit.  Yearly screening should stop if you develop a health problem that would prevent you from having lung cancer treatment.  Breast Cancer  Practice breast self-awareness. This means understanding how your breasts normally appear and feel.  It also means doing regular breast self-exams. Let your health care provider know about any changes, no matter how small.  If you are in your 20s or 30s, you should have a clinical breast exam (CBE) by a health care provider every 1-3 years as part of a regular health exam.  If you are 20 or older, have a CBE every year. Also consider having a breast X-ray (  mammogram) every year.  If you have a family history of breast cancer, talk to your health care provider about genetic screening.  If you are at high risk for breast cancer, talk to your health care provider about having an MRI and a mammogram every year.  Breast cancer gene (BRCA) assessment is recommended for women who have family members with BRCA-related cancers. BRCA-related cancers include:  Breast.  Ovarian.  Tubal.  Peritoneal cancers.  Results of the assessment will determine the need for genetic counseling and BRCA1 and BRCA2 testing. Cervical  Cancer Your health care provider may recommend that you be screened regularly for cancer of the pelvic organs (ovaries, uterus, and vagina). This screening involves a pelvic examination, including checking for microscopic changes to the surface of your cervix (Pap test). You may be encouraged to have this screening done every 3 years, beginning at age 37.  For women ages 37-65, health care providers may recommend pelvic exams and Pap testing every 3 years, or they may recommend the Pap and pelvic exam, combined with testing for human papilloma virus (HPV), every 5 years. Some types of HPV increase your risk of cervical cancer. Testing for HPV may also be done on women of any age with unclear Pap test results.  Other health care providers may not recommend any screening for nonpregnant women who are considered low risk for pelvic cancer and who do not have symptoms. Ask your health care provider if a screening pelvic exam is right for you.  If you have had past treatment for cervical cancer or a condition that could lead to cancer, you need Pap tests and screening for cancer for at least 20 years after your treatment. If Pap tests have been discontinued, your risk factors (such as having a new sexual partner) need to be reassessed to determine if screening should resume. Some women have medical problems that increase the chance of getting cervical cancer. In these cases, your health care provider may recommend more frequent screening and Pap tests. Colorectal Cancer  This type of cancer can be detected and often prevented.  Routine colorectal cancer screening usually begins at 37 years of age and continues through 37 years of age.  Your health care provider may recommend screening at an earlier age if you have risk factors for colon cancer.  Your health care provider may also recommend using home test kits to check for hidden blood in the stool.  A small camera at the end of a tube can be used to  examine your colon directly (sigmoidoscopy or colonoscopy). This is done to check for the earliest forms of colorectal cancer.  Routine screening usually begins at age 37.  Direct examination of the colon should be repeated every 5-10 years through 37 years of age. However, you may need to be screened more often if early forms of precancerous polyps or small growths are found. Skin Cancer  Check your skin from head to toe regularly.  Tell your health care provider about any new moles or changes in moles, especially if there is a change in a mole's shape or color.  Also tell your health care provider if you have a mole that is larger than the size of a pencil eraser.  Always use sunscreen. Apply sunscreen liberally and repeatedly throughout the day.  Protect yourself by wearing long sleeves, pants, a wide-brimmed hat, and sunglasses whenever you are outside. HEART DISEASE, DIABETES, AND HIGH BLOOD PRESSURE   High blood pressure causes heart disease  and increases the risk of stroke. High blood pressure is more likely to develop in:  People who have blood pressure in the high end of the normal range (130-139/85-89 mm Hg).  People who are overweight or obese.  People who are African American.  If you are 72-46 years of age, have your blood pressure checked every 3-5 years. If you are 89 years of age or older, have your blood pressure checked every year. You should have your blood pressure measured twice--once when you are at a hospital or clinic, and once when you are not at a hospital or clinic. Record the average of the two measurements. To check your blood pressure when you are not at a hospital or clinic, you can use:  An automated blood pressure machine at a pharmacy.  A home blood pressure monitor.  If you are between 60 years and 32 years old, ask your health care provider if you should take aspirin to prevent strokes.  Have regular diabetes screenings. This involves taking a  blood sample to check your fasting blood sugar level.  If you are at a normal weight and have a low risk for diabetes, have this test once every three years after 37 years of age.  If you are overweight and have a high risk for diabetes, consider being tested at a younger age or more often. PREVENTING INFECTION  Hepatitis B  If you have a higher risk for hepatitis B, you should be screened for this virus. You are considered at high risk for hepatitis B if:  You were born in a country where hepatitis B is common. Ask your health care provider which countries are considered high risk.  Your parents were born in a high-risk country, and you have not been immunized against hepatitis B (hepatitis B vaccine).  You have HIV or AIDS.  You use needles to inject street drugs.  You live with someone who has hepatitis B.  You have had sex with someone who has hepatitis B.  You get hemodialysis treatment.  You take certain medicines for conditions, including cancer, organ transplantation, and autoimmune conditions. Hepatitis C  Blood testing is recommended for:  Everyone born from [birth year] through 1967.  Anyone with known risk factors for hepatitis C. Sexually transmitted infections (STIs)  You should be screened for sexually transmitted infections (STIs) including gonorrhea and chlamydia if:  You are sexually active and are younger than 37 years of age.  You are older than 37 years of age and your health care provider tells you that you are at risk for this type of infection.  Your sexual activity has changed since you were last screened and you are at an increased risk for chlamydia or gonorrhea. Ask your health care provider if you are at risk.  If you do not have HIV, but are at risk, it may be recommended that you take a prescription medicine daily to prevent HIV infection. This is called pre-exposure prophylaxis (PrEP). You are considered at risk if:  You are sexually active and do  not regularly use condoms or know the HIV status of your partner(s).  You take drugs by injection.  You are sexually active with a partner who has HIV. Talk with your health care provider about whether you are at high risk of being infected with HIV. If you choose to begin PrEP, you should first be tested for HIV. You should then be tested every 3 months for as long as you are taking PrEP.  PREGNANCY   If you are premenopausal and you may become pregnant, ask your health care provider about preconception counseling.  If you may become pregnant, take 400 to 800 micrograms (mcg) of folic acid every day.  If you want to prevent pregnancy, talk to your health care provider about birth control (contraception). OSTEOPOROSIS AND MENOPAUSE   Osteoporosis is a disease in which the bones lose minerals and strength with aging. This can result in serious bone fractures. Your risk for osteoporosis can be identified using a bone density scan.  If you are 49 years of age or older, or if you are at risk for osteoporosis and fractures, ask your health care provider if you should be screened.  Ask your health care provider whether you should take a calcium or vitamin D supplement to lower your risk for osteoporosis.  Menopause may have certain physical symptoms and risks.  Hormone replacement therapy may reduce some of these symptoms and risks. Talk to your health care provider about whether hormone replacement therapy is right for you.  HOME CARE INSTRUCTIONS   Schedule regular health, dental, and eye exams.  Stay current with your immunizations.   Do not use any tobacco products including cigarettes, chewing tobacco, or electronic cigarettes.  If you are pregnant, do not drink alcohol.  If you are breastfeeding, limit how much and how often you drink alcohol.  Limit alcohol intake to no more than 1 drink per day for nonpregnant women. One drink equals 12 ounces of beer, 5 ounces of wine, or 1  ounces of hard liquor.  Do not use street drugs.  Do not share needles.  Ask your health care provider for help if you need support or information about quitting drugs.  Tell your health care provider if you often feel depressed.  Tell your health care provider if you have ever been abused or do not feel safe at home.   This information is not intended to replace advice given to you by your health care provider. Make sure you discuss any questions you have with your health care provider.   Document Released: 06/26/2011 Document Revised: 01/01/2015 Document Reviewed: 11/12/2013 Elsevier Interactive Patient Education [employer].

## 2016-01-20 NOTE — Progress Notes (Signed)
Pre visit review using our clinic review tool, if applicable. No additional management support is needed unless otherwise documented below in the visit note. 

## 2016-01-20 NOTE — Progress Notes (Signed)
Subjective:  Patient ID: Lindsey Juarez, female    DOB: 03-27-1981  Age: 35 y.o. MRN: [id number]  CC: Establish care, Ear pain  HPI Lindsey Juarez is a 35 y.o. female presents to the clinic today to establish care. Primary concern today is ear pain.  Ear pain  Patient reports that she had a upper respiratory infection approximately week ago. She states that this was going around her home as she has 2 children.  She slowly improved but then subsequently developed severe right ear pain yesterday.  It persisted throughout the night. She had severe pain in the middle of night and reported that she heard a pop.  She said that she had a decrease in her pain after the pop.  She then used her son's eardrops as she thought she may have an infection.  She awoke this morning and noted a significant amount of blood on her pillow and in her ear.  No associated fevers or chills. No other complaints at this time.  No known exacerbating factors. She had relief with pop as outlined above but otherwise has not had any resolution of her issue.   PMH, Surgical Hx, Family Hx, Social History reviewed and updated as below.  Past Medical History  Diagnosis Date  . Abnormal Pap smear   . Bicuspid aortic valve     Last Echo 07/24/14; Bicuspid; mildly thickened leaflets. Mild stenosis.   Past Surgical History  Procedure Laterality Date  . No past surgeries     Family History  Problem Relation Age of Onset  . Goiter Father   . Hypertension Father   . Hypertension Mother     also high chol and hyperthyroid after pregnancy  . Heart disease Paternal Grandfather     ?cancer  . Coronary artery disease Maternal Grandfather     CABG at 62  . Stroke Maternal Grandmother 40    high chol  . Anesthesia problems Neg Hx   . Hypothyroidism Sister    Social History  Substance Use Topics  . Smoking status: Former Smoker -- 0.50 packs/day    Quit date: 12/25/1998  . Smokeless tobacco: Never Used  .  Alcohol Use: No   Review of Systems  HENT: Positive for tinnitus.        Trouble hearing.  Cardiovascular: Positive for palpitations.  Genitourinary:       History of UTI and incontinence.  Neurological: Positive for dizziness, weakness and headaches.  All other systems reviewed and are negative.  Objective:   Today's Vitals: BP 112/62 mmHg  Pulse 98  Temp(Src) 97.7 F (36.5 C) (Oral)  Ht 5\' 2"  (1.575 m)  Wt 110 lb 8 oz (50.122 kg)  BMI 20.21 kg/m2  SpO2 98%  LMP   Breastfeeding? Yes  Physical Exam  Constitutional: She is oriented to person, place, and time. She appears well-developed and well-nourished. No distress.  HENT:  Head: Normocephalic and atraumatic.  Nose: Nose normal.  Mouth/Throat: Oropharynx is clear and moist. No oropharyngeal exudate.  Left TM normal. Right TM- severe erythema/ mild retraction. Small amount of blood noted. Could not appreciate perforation.  Eyes: Conjunctivae are normal. No scleral icterus.  Neck: Neck supple.  Cardiovascular: Normal rate and regular rhythm.   No murmur heard. Pulmonary/Chest: Effort normal and breath sounds normal. She has no wheezes. She has no rales.  Abdominal: Soft. She exhibits no distension. There is no tenderness. There is no rebound and no guarding.  Musculoskeletal: Normal range of motion. She exhibits no  edema.  Lymphadenopathy:    She has no cervical adenopathy.  Neurological: She is alert and oriented to person, place, and time.  Skin: Skin is warm and dry. No rash noted.  Psychiatric: She has a normal mood and affect.  Vitals reviewed.  Assessment & Plan:   Problem List Items Addressed This Visit    Otitis media - Primary    New problem. Treating with Omnicef. Could not appreciate perforation but history and exam concerning. Sending to ENT for further eval.      Relevant Medications   cefdinir (OMNICEF) 300 MG capsule   Other Relevant Orders   Ambulatory referral to ENT   Bleeding from ear    Relevant Orders   Ambulatory referral to ENT      Outpatient Encounter Prescriptions as of 01/20/2016  Medication Sig  . Multiple Vitamin (MULTIVITAMIN) tablet Take 1 tablet by mouth daily.  . cefdinir (OMNICEF) 300 MG capsule Take 2 capsules (600 mg total) by mouth daily.  . [DISCONTINUED] ibuprofen (ADVIL,MOTRIN) 600 MG tablet Take 1 tablet (600 mg total) by mouth every 6 (six) hours.  . [DISCONTINUED] Iron-FA-B Cmp-C-Biot-Probiotic (FUSION PLUS) CAPS Take 1 capsule by mouth 2 (two) times daily.  . [DISCONTINUED] Prenatal Vit-Fe Fumarate-FA (PRENATAL MULTIVITAMIN) TABS tablet Take 1 tablet by mouth daily at 12 noon.   No facility-administered encounter medications on file as of 01/20/2016.    Follow-up: Annually.   [place]

## 2016-07-04 DIAGNOSIS — R109 Unspecified abdominal pain: Secondary | ICD-10-CM | POA: Diagnosis not present

## 2016-07-04 DIAGNOSIS — R300 Dysuria: Secondary | ICD-10-CM | POA: Diagnosis not present

## 2016-07-04 DIAGNOSIS — R1031 Right lower quadrant pain: Secondary | ICD-10-CM | POA: Diagnosis not present

## 2016-09-18 ENCOUNTER — Telehealth: Payer: Self-pay | Admitting: Family Medicine

## 2016-09-18 DIAGNOSIS — L501 Idiopathic urticaria: Secondary | ICD-10-CM | POA: Diagnosis not present

## 2016-09-18 NOTE — Telephone Encounter (Signed)
Patient Name: Lindsey Juarez  DOB: 03/15/1981    Initial Comment Caller states she has been may be having allergic reaction, unknown cause, recently had hives and swollen lips, today short of breath   Nurse Assessment  Nurse: Raphael Gibney, RN, Vanita Ingles Date/Time (Eastern Time): 09/18/2016 2:00:08 PM  Confirm and document reason for call. If symptomatic, describe symptoms. You must click the next button to save text entered. ---Caller states she might be having an allergic reaction. No new meds and no new soap or detergent. Has had hives on her legs, buttocks. Had swelling in her lips Saturday. She has canker sores on her lips. She is having a little nausea and she is a little SOB. Chest pressure.  Has the patient traveled out of the country within the last 30 days? ---Not Applicable  Does the patient have any new or worsening symptoms? ---Yes  Will a triage be completed? ---Yes  Related visit to physician within the last 2 weeks? ---No  Does the PT have any chronic conditions? (i.e. diabetes, asthma, etc.) ---No  Is the patient pregnant or possibly pregnant? (Ask all females between the ages of 49-55) ---No  Is this a behavioral health or substance abuse call? ---No     Guidelines    Guideline Title Affirmed Question Affirmed Notes  Chest Pain Difficulty breathing    Final Disposition User   Go to ED Now Raphael Gibney, RN, Vanita Ingles    Comments  Pt states she does not want to go to the ER as she does not have time to go. Called back line at office and spoke to and gave report to Wren that pt has had hives, but is now having chest pressure, a little SOB and nausea with triage outcome of go to ER now but states she does not want to go  [place] states that the nurse said pt needs to go to the ER or at least the walk in clinic. Notified pt and she verbalized understanding. states she will go to the walk in clinic.   Referrals  GO TO FACILITY REFUSED   Disagree/Comply: Disagree  Disagree/Comply Reason:  Disagree with instructions

## 2016-10-31 DIAGNOSIS — Z01419 Encounter for gynecological examination (general) (routine) without abnormal findings: Secondary | ICD-10-CM | POA: Diagnosis not present

## 2016-10-31 DIAGNOSIS — Z681 Body mass index (BMI) 19 or less, adult: Secondary | ICD-10-CM | POA: Diagnosis not present

## 2017-02-16 ENCOUNTER — Encounter: Payer: BLUE CROSS/BLUE SHIELD | Admitting: Family Medicine

## 2017-02-27 ENCOUNTER — Ambulatory Visit (INDEPENDENT_AMBULATORY_CARE_PROVIDER_SITE_OTHER): Payer: BLUE CROSS/BLUE SHIELD | Admitting: Family Medicine

## 2017-02-27 ENCOUNTER — Encounter: Payer: Self-pay | Admitting: Family Medicine

## 2017-02-27 VITALS — BP 119/77 | HR 70 | Temp 98.5°F | Ht 62.5 in | Wt 117.4 lb

## 2017-02-27 DIAGNOSIS — Z0000 Encounter for general adult medical examination without abnormal findings: Secondary | ICD-10-CM | POA: Diagnosis not present

## 2017-02-27 DIAGNOSIS — Z13220 Encounter for screening for lipoid disorders: Secondary | ICD-10-CM | POA: Diagnosis not present

## 2017-02-27 LAB — LIPID PANEL
Cholesterol: 176 mg/dL (ref 0–200)
HDL: 65.5 mg/dL (ref >39.00)
LDL Cholesterol: 100 mg/dL — ABNORMAL HIGH (ref 0–99)
NonHDL: 110.09
TRIGLYCERIDES: 52 mg/dL (ref 0.0–149.0)
Total CHOL/HDL Ratio: 3
VLDL: 10.4 mg/dL (ref 0.0–40.0)

## 2017-02-27 LAB — CBC
HCT: 40.5 % (ref 36.0–46.0)
HEMOGLOBIN: 13.6 g/dL (ref 12.0–15.0)
MCHC: 33.6 g/dL (ref 30.0–36.0)
MCV: 93.1 fl (ref 78.0–100.0)
PLATELETS: 281 10*3/uL (ref 150.0–400.0)
RBC: 4.36 Mil/uL (ref 3.87–5.11)
RDW: 13 % (ref 11.5–15.5)
WBC: 9.4 10*3/uL (ref 4.0–10.5)

## 2017-02-27 LAB — COMPREHENSIVE METABOLIC PANEL
ALT: 17 U/L (ref 0–35)
AST: 16 U/L (ref 0–37)
Albumin: 4.6 g/dL (ref 3.5–5.2)
Alkaline Phosphatase: 53 U/L (ref 39–117)
BUN: 16 mg/dL (ref 6–23)
CALCIUM: 10.1 mg/dL (ref 8.4–10.5)
CO2: 34 mEq/L — ABNORMAL HIGH (ref 19–32)
Chloride: 101 mEq/L (ref 96–112)
Creatinine, Ser: 0.74 mg/dL (ref 0.40–1.20)
GFR: 93.74 mL/min (ref >60.00)
Glucose, Bld: 80 mg/dL (ref 70–99)
Potassium: 4.9 mEq/L (ref 3.5–5.1)
Sodium: 139 mEq/L (ref 135–145)
Total Bilirubin: 0.7 mg/dL (ref 0.2–1.2)
Total Protein: 7.4 g/dL (ref 6.0–8.3)

## 2017-02-27 LAB — VITAMIN D 25 HYDROXY (VIT D DEFICIENCY, FRACTURES): VITD: 26.58 ng/mL — ABNORMAL LOW (ref 30.00–100.00)

## 2017-02-27 LAB — TSH: TSH: 2.42 u[IU]/mL (ref 0.35–4.50)

## 2017-02-27 NOTE — Assessment & Plan Note (Signed)
Declines flu. Remainder of preventative healthcare up-to-date. Her bicuspid aortic valve has been stable. Will repeat echo in 2020. Screening labs today.

## 2017-02-27 NOTE — Patient Instructions (Signed)
Follow up annually.  Take care  Dr. Lacinda Axon   Health Maintenance, Female Adopting a healthy lifestyle and getting preventive care can go a long way to promote health and wellness. Talk with your health care provider about what schedule of regular examinations is right for you. This is a good chance for you to check in with your provider about disease prevention and staying healthy. In between checkups, there are plenty of things you can do on your own. Experts have done a lot of research about which lifestyle changes and preventive measures are most likely to keep you healthy. Ask your health care provider for more information. Weight and diet Eat a healthy diet  Be sure to include plenty of vegetables, fruits, low-fat dairy products, and lean protein.  Do not eat a lot of foods high in solid fats, added sugars, or salt.  Get regular exercise. This is one of the most important things you can do for your health.  Most adults should exercise for at least 150 minutes each week. The exercise should increase your heart rate and make you sweat (moderate-intensity exercise).  Most adults should also do strengthening exercises at least twice a week. This is in addition to the moderate-intensity exercise. Maintain a healthy weight  Body mass index (BMI) is a measurement that can be used to identify possible weight problems. It estimates body fat based on height and weight. Your health care provider can help determine your BMI and help you achieve or maintain a healthy weight.  For females 82 years of age and older:  A BMI below 18.5 is considered underweight.  A BMI of 18.5 to 24.9 is normal.  A BMI of 25 to 29.9 is considered overweight.  A BMI of 30 and above is considered obese. Watch levels of cholesterol and blood lipids  You should start having your blood tested for lipids and cholesterol at 38 years of age, then have this test every 5 years.  You may need to have your cholesterol  levels checked more often if:  Your lipid or cholesterol levels are high.  You are older than 38 years of age.  You are at high risk for heart disease. Cancer screening Lung Cancer  Lung cancer screening is recommended for adults 8-20 years old who are at high risk for lung cancer because of a history of smoking.  A yearly low-dose CT scan of the lungs is recommended for people who:  Currently smoke.  Have quit within the past 15 years.  Have at least a 30-pack-year history of smoking. A pack year is smoking an average of one pack of cigarettes a day for 1 year.  Yearly screening should continue until it has been 15 years since you quit.  Yearly screening should stop if you develop a health problem that would prevent you from having lung cancer treatment. Breast Cancer  Practice breast self-awareness. This means understanding how your breasts normally appear and feel.  It also means doing regular breast self-exams. Let your health care provider know about any changes, no matter how small.  If you are in your 20s or 30s, you should have a clinical breast exam (CBE) by a health care provider every 1-3 years as part of a regular health exam.  If you are 12 or older, have a CBE every year. Also consider having a breast X-ray (mammogram) every year.  If you have a family history of breast cancer, talk to your health care provider about genetic screening.  If you are at high risk for breast cancer, talk to your health care provider about having an MRI and a mammogram every year.  Breast cancer gene (BRCA) assessment is recommended for women who have family members with BRCA-related cancers. BRCA-related cancers include:  Breast.  Ovarian.  Tubal.  Peritoneal cancers.  Results of the assessment will determine the need for genetic counseling and BRCA1 and BRCA2 testing. Cervical Cancer  Your health care provider may recommend that you be screened regularly for cancer of the  pelvic organs (ovaries, uterus, and vagina). This screening involves a pelvic examination, including checking for microscopic changes to the surface of your cervix (Pap test). You may be encouraged to have this screening done every 3 years, beginning at age 21.  For women ages 30-65, health care providers may recommend pelvic exams and Pap testing every 3 years, or they may recommend the Pap and pelvic exam, combined with testing for human papilloma virus (HPV), every 5 years. Some types of HPV increase your risk of cervical cancer. Testing for HPV may also be done on women of any age with unclear Pap test results.  Other health care providers may not recommend any screening for nonpregnant women who are considered low risk for pelvic cancer and who do not have symptoms. Ask your health care provider if a screening pelvic exam is right for you.  If you have had past treatment for cervical cancer or a condition that could lead to cancer, you need Pap tests and screening for cancer for at least 20 years after your treatment. If Pap tests have been discontinued, your risk factors (such as having a new sexual partner) need to be reassessed to determine if screening should resume. Some women have medical problems that increase the chance of getting cervical cancer. In these cases, your health care provider may recommend more frequent screening and Pap tests. Colorectal Cancer  This type of cancer can be detected and often prevented.  Routine colorectal cancer screening usually begins at 38 years of age and continues through 38 years of age.  Your health care provider may recommend screening at an earlier age if you have risk factors for colon cancer.  Your health care provider may also recommend using home test kits to check for hidden blood in the stool.  A small camera at the end of a tube can be used to examine your colon directly (sigmoidoscopy or colonoscopy). This is done to check for the earliest  forms of colorectal cancer.  Routine screening usually begins at age 50.  Direct examination of the colon should be repeated every 5-10 years through 38 years of age. However, you may need to be screened more often if early forms of precancerous polyps or small growths are found. Skin Cancer  Check your skin from head to toe regularly.  Tell your health care provider about any new moles or changes in moles, especially if there is a change in a mole's shape or color.  Also tell your health care provider if you have a mole that is larger than the size of a pencil eraser.  Always use sunscreen. Apply sunscreen liberally and repeatedly throughout the day.  Protect yourself by wearing long sleeves, pants, a wide-brimmed hat, and sunglasses whenever you are outside. Heart disease, diabetes, and high blood pressure  High blood pressure causes heart disease and increases the risk of stroke. High blood pressure is more likely to develop in:  People who have blood pressure in the   high end of the normal range (130-139/85-89 mm Hg).  People who are overweight or obese.  People who are African American.  If you are 18-39 years of age, have your blood pressure checked every 3-5 years. If you are 40 years of age or older, have your blood pressure checked every year. You should have your blood pressure measured twice-once when you are at a hospital or clinic, and once when you are not at a hospital or clinic. Record the average of the two measurements. To check your blood pressure when you are not at a hospital or clinic, you can use:  An automated blood pressure machine at a pharmacy.  A home blood pressure monitor.  If you are between 55 years and 79 years old, ask your health care provider if you should take aspirin to prevent strokes.  Have regular diabetes screenings. This involves taking a blood sample to check your fasting blood sugar level.  If you are at a normal weight and have a low  risk for diabetes, have this test once every three years after 38 years of age.  If you are overweight and have a high risk for diabetes, consider being tested at a younger age or more often. Preventing infection Hepatitis B  If you have a higher risk for hepatitis B, you should be screened for this virus. You are considered at high risk for hepatitis B if:  You were born in a country where hepatitis B is common. Ask your health care provider which countries are considered high risk.  Your parents were born in a high-risk country, and you have not been immunized against hepatitis B (hepatitis B vaccine).  You have HIV or AIDS.  You use needles to inject street drugs.  You live with someone who has hepatitis B.  You have had sex with someone who has hepatitis B.  You get hemodialysis treatment.  You take certain medicines for conditions, including cancer, organ transplantation, and autoimmune conditions. Hepatitis C  Blood testing is recommended for:  Everyone born from 1945 through 1965.  Anyone with known risk factors for hepatitis C. Sexually transmitted infections (STIs)  You should be screened for sexually transmitted infections (STIs) including gonorrhea and chlamydia if:  You are sexually active and are younger than 38 years of age.  You are older than 38 years of age and your health care provider tells you that you are at risk for this type of infection.  Your sexual activity has changed since you were last screened and you are at an increased risk for chlamydia or gonorrhea. Ask your health care provider if you are at risk.  If you do not have HIV, but are at risk, it may be recommended that you take a prescription medicine daily to prevent HIV infection. This is called pre-exposure prophylaxis (PrEP). You are considered at risk if:  You are sexually active and do not regularly use condoms or know the HIV status of your partner(s).  You take drugs by  injection.  You are sexually active with a partner who has HIV. Talk with your health care provider about whether you are at high risk of being infected with HIV. If you choose to begin PrEP, you should first be tested for HIV. You should then be tested every 3 months for as long as you are taking PrEP. Pregnancy  If you are premenopausal and you may become pregnant, ask your health care provider about preconception counseling.  If you may become   pregnant, take 400 to 800 micrograms (mcg) of folic acid every day.  If you want to prevent pregnancy, talk to your health care provider about birth control (contraception). Osteoporosis and menopause  Osteoporosis is a disease in which the bones lose minerals and strength with aging. This can result in serious bone fractures. Your risk for osteoporosis can be identified using a bone density scan.  If you are 1 years of age or older, or if you are at risk for osteoporosis and fractures, ask your health care provider if you should be screened.  Ask your health care provider whether you should take a calcium or vitamin D supplement to lower your risk for osteoporosis.  Menopause may have certain physical symptoms and risks.  Hormone replacement therapy may reduce some of these symptoms and risks. Talk to your health care provider about whether hormone replacement therapy is right for you. Follow these instructions at home:  Schedule regular health, dental, and eye exams.  Stay current with your immunizations.  Do not use any tobacco products including cigarettes, chewing tobacco, or electronic cigarettes.  If you are pregnant, do not drink alcohol.  If you are breastfeeding, limit how much and how often you drink alcohol.  Limit alcohol intake to no more than 1 drink per day for nonpregnant women. One drink equals 12 ounces of beer, 5 ounces of wine, or 1 ounces of hard liquor.  Do not use street drugs.  Do not share needles.  Ask  your health care provider for help if you need support or information about quitting drugs.  Tell your health care provider if you often feel depressed.  Tell your health care provider if you have ever been abused or do not feel safe at home. This information is not intended to replace advice given to you by your health care provider. Make sure you discuss any questions you have with your health care provider. Document Released: 06/26/2011 Document Revised: 05/18/2016 Document Reviewed: 09/14/2015 Elsevier Interactive Patient Education  2017 [employer].

## 2017-02-27 NOTE — Progress Notes (Signed)
Subjective:  Patient ID: Lindsey Juarez, female    DOB: April 05, 1981  Age: 36 y.o. MRN: [id number]  CC: Annual physical exam  HPI Lindsey Juarez is a 36 y.o. female presents to the clinic today for an annual physical exam.  Preventative Healthcare  Pap smear: Up to date. 2017.  Immunizations  Tetanus - Up to date.   Flu - Declines.   Labs: Screening labs today.  Alcohol use: No.   Smoking/tobacco use: Former.  STD/HIV testing: Up to date.   PMH, Surgical Hx, Family Hx, Social History reviewed and updated as below.  Past Medical History:  Diagnosis Date  . Abnormal Pap smear   . Bicuspid aortic valve    Last Echo 07/24/14; Bicuspid; mildly thickened leaflets. Mild stenosis.   Past Surgical History:  Procedure Laterality Date  . NO PAST SURGERIES     Family History  Problem Relation Age of Onset  . Goiter Father   . Hypertension Father   . Hypertension Mother     also high chol and hyperthyroid after pregnancy  . Heart disease Paternal Grandfather     ?cancer  . Coronary artery disease Maternal Grandfather     CABG at 72  . Stroke Maternal Grandmother 40    high chol  . Anesthesia problems Neg Hx   . Hypothyroidism Sister    Social History  Substance Use Topics  . Smoking status: Former Smoker    Packs/day: 0.50    Quit date: 12/25/1998  . Smokeless tobacco: Never Used  . Alcohol use No   Review of Systems General: Denies unexplained weight loss, fever. Skin: Denies new or changing mole, sore/wound that won't heal. ENT: Trouble hearing, ringing in the ears, sores in the mouth, hoarseness, trouble swallowing. Eyes: Denies trouble seeing/visual disturbance. Heart/CV: Denies chest pain, shortness of breath, edema, palpitations. Lungs/Resp: Denies cough, shortness of breath, hemoptysis. Abd/GI: Denies nausea, vomiting, diarrhea, constipation, abdominal pain, hematochezia, melena. GU: Denies dysuria, incontinence, hematuria, urinary frequency, difficulty  starting/keeping stream, vaginal discharge, sexual difficulty, lump in breasts. MSK: Denies joint pain/swelling, myalgias. Neuro: Denies headaches, weakness, numbness, dizziness, syncope. Psych: Denies sadness, anxiety, stress, memory difficulty. Endocrine: Denies polyuria and polydipsia.  Objective:   Today's Vitals: BP 119/77   Pulse 70   Temp 98.5 F (36.9 C) (Oral)   Ht 5' 2.5" (1.588 m)   Wt 117 lb 6.4 oz (53.3 kg)   SpO2 98%   BMI 21.13 kg/m   Physical Exam  Constitutional: She is oriented to person, place, and time. She appears well-developed and well-nourished. No distress.  HENT:  Head: Normocephalic and atraumatic.  Nose: Nose normal.  Mouth/Throat: Oropharynx is clear and moist. No oropharyngeal exudate.  Normal TM's bilaterally.   Eyes: Conjunctivae are normal. No scleral icterus.  Neck: Neck supple.  Cardiovascular: Normal rate and regular rhythm.   No murmur heard. Pulmonary/Chest: Effort normal and breath sounds normal. She has no wheezes. She has no rales.  Abdominal: Soft. She exhibits no distension. There is no tenderness. There is no rebound and no guarding.  Musculoskeletal: Normal range of motion. She exhibits no edema.  Lymphadenopathy:    She has no cervical adenopathy.  Neurological: She is alert and oriented to person, place, and time.  Skin: Skin is warm and dry. No rash noted.  Psychiatric: She has a normal mood and affect.  Vitals reviewed.  Assessment & Plan:   Problem List Items Addressed This Visit    Annual physical exam - Primary  Declines flu. Remainder of preventative healthcare up-to-date. Her bicuspid aortic valve has been stable. Will repeat echo in 2020. Screening labs today.      Relevant Orders   CBC   Comprehensive metabolic panel   Lipid panel   TSH   Vitamin D (25 hydroxy)      Outpatient Encounter Prescriptions as of 02/27/2017  Medication Sig  . levonorgestrel (MIRENA) 20 MCG/24HR IUD 1 each by Intrauterine  route once.  . [DISCONTINUED] cefdinir (OMNICEF) 300 MG capsule Take 2 capsules (600 mg total) by mouth daily.  . [DISCONTINUED] Multiple Vitamin (MULTIVITAMIN) tablet Take 1 tablet by mouth daily.   No facility-administered encounter medications on file as of 02/27/2017.     Follow-up: Annually.   [place]

## 2017-02-28 ENCOUNTER — Other Ambulatory Visit: Payer: Self-pay | Admitting: Family Medicine

## 2017-02-28 MED ORDER — VITAMIN D (ERGOCALCIFEROL) 1.25 MG (50000 UNIT) PO CAPS: 50000.0000 [IU] | ORAL_CAPSULE | ORAL | 0 refills | Status: DC

## 2017-05-22 ENCOUNTER — Other Ambulatory Visit: Payer: Self-pay | Admitting: Family Medicine

## 2017-09-15 ENCOUNTER — Other Ambulatory Visit: Payer: Self-pay | Admitting: Family Medicine

## 2017-09-17 NOTE — Telephone Encounter (Signed)
Needs to establish with new PCP

## 2018-02-28 ENCOUNTER — Encounter: Payer: BLUE CROSS/BLUE SHIELD | Admitting: Family Medicine

## 2018-04-03 ENCOUNTER — Encounter: Payer: Self-pay | Admitting: Family Medicine

## 2018-04-03 ENCOUNTER — Other Ambulatory Visit: Payer: Self-pay

## 2018-04-03 ENCOUNTER — Ambulatory Visit (INDEPENDENT_AMBULATORY_CARE_PROVIDER_SITE_OTHER): Payer: BLUE CROSS/BLUE SHIELD | Admitting: Family Medicine

## 2018-04-03 VITALS — BP 100/70 | HR 66 | Temp 98.2°F | Ht 62.5 in | Wt 124.6 lb

## 2018-04-03 DIAGNOSIS — Q231 Congenital insufficiency of aortic valve: Secondary | ICD-10-CM | POA: Diagnosis not present

## 2018-04-03 DIAGNOSIS — Z0001 Encounter for general adult medical examination with abnormal findings: Secondary | ICD-10-CM | POA: Diagnosis not present

## 2018-04-03 DIAGNOSIS — E559 Vitamin D deficiency, unspecified: Secondary | ICD-10-CM

## 2018-04-03 DIAGNOSIS — D229 Melanocytic nevi, unspecified: Secondary | ICD-10-CM

## 2018-04-03 DIAGNOSIS — Z0000 Encounter for general adult medical examination without abnormal findings: Secondary | ICD-10-CM | POA: Insufficient documentation

## 2018-04-03 LAB — VITAMIN D 25 HYDROXY (VIT D DEFICIENCY, FRACTURES): VITD: 24.61 ng/mL — AB (ref 30.00–100.00)

## 2018-04-03 LAB — COMPREHENSIVE METABOLIC PANEL
ALBUMIN: 4.5 g/dL (ref 3.5–5.2)
ALT: 30 U/L (ref 0–35)
AST: 23 U/L (ref 0–37)
Alkaline Phosphatase: 53 U/L (ref 39–117)
BUN: 13 mg/dL (ref 6–23)
CALCIUM: 9.7 mg/dL (ref 8.4–10.5)
CO2: 28 mEq/L (ref 19–32)
Chloride: 103 mEq/L (ref 96–112)
Creatinine, Ser: 0.66 mg/dL (ref 0.40–1.20)
GFR: 106.34 mL/min (ref >60.00)
Glucose, Bld: 82 mg/dL (ref 70–99)
POTASSIUM: 4.3 meq/L (ref 3.5–5.1)
SODIUM: 138 meq/L (ref 135–145)
TOTAL PROTEIN: 7.7 g/dL (ref 6.0–8.3)
Total Bilirubin: 0.9 mg/dL (ref 0.2–1.2)

## 2018-04-03 NOTE — Assessment & Plan Note (Signed)
Refer to dermatology 

## 2018-04-03 NOTE — Patient Instructions (Signed)
Nice to see you. Please continue to exercise and monitor your diet. Please call your gynecologist to set up a yearly gynecologic exam. We will refer you to dermatology and cardiology. We will check lab work today and contact you with the results.

## 2018-04-03 NOTE — Assessment & Plan Note (Signed)
Physical exam completed.  Encouraged diet and exercise.  She will contact her gynecologist for a gynecologic visit and breast exam.  Lab work as outlined below.

## 2018-04-03 NOTE — Progress Notes (Signed)
Lindsey Rumps, MD Phone: [phone]  Lindsey Juarez is a 37 y.o. female who presents today for cpe.  Goes to the gym 3 days a week. Eats healthy though does eat a lot of protein bars and shakes. Tetanus vaccination up-to-date. Pap smear and breast exam through gynecology.  Does report a history of colposcopy several years ago.  No menstrual cycle given that she has an IUD. HIV testing up-to-date. Flu vaccination and tetanus vaccination up-to-date. No family history of breast cancer, colon cancer, or ovarian cancer. No tobacco use or illicit drug use.  Occasional alcohol use. She does report a history of bicuspid aortic valve and wonders if she needs to follow-up with cardiology.  Reports a new mole on her back that she is only noted over the last month or so.  She is not able to look at it.  Active Ambulatory Problems    Diagnosis Date Noted  . Bicuspid aortic valve 05/06/2009  . Encounter for general adult medical examination with abnormal findings 04/03/2018  . Multiple nevi 04/03/2018   Resolved Ambulatory Problems    Diagnosis Date Noted  . MIGRAINE WITH AURA 05/06/2009  . ALLERGIC RHINITIS WITH CONJUNCTIVITIS 06/07/2010  . TACHYCARDIA 06/13/2010  . PAP SMEAR, ABNORMAL, ASCUS 05/06/2009  . Syncope 04/06/2012  . PP care - s/p NVB 4/12 04/06/2012  . Indication for care or intervention in labor or delivery 09/24/2014  . Postpartum care following vaginal delivery (10/1) 09/24/2014  . Postpartum hemorrhage 09/24/2014  . Otitis media 01/20/2016  . Bleeding from ear 01/20/2016  . Annual physical exam 02/27/2017   Past Medical History:  Diagnosis Date  . Abnormal Pap smear   . Bicuspid aortic valve     Family History  Problem Relation Age of Onset  . Goiter Father   . Hypertension Father   . Hypertension Mother        also high chol and hyperthyroid after pregnancy  . Heart disease Paternal Grandfather        ?cancer  . Coronary artery disease Maternal  Grandfather        CABG at 22  . Stroke Maternal Grandmother 40       high chol  . Anesthesia problems Neg Hx   . Hypothyroidism Sister     Social History   Socioeconomic History  . Marital status: Married    Spouse name: Not on file  . Number of children: Not on file  . Years of education: Not on file  . Highest education level: Not on file  Occupational History  . Not on file  Social Needs  . Financial resource strain: Not on file  . Food insecurity:    Worry: Not on file    Inability: Not on file  . Transportation needs:    Medical: Not on file    Non-medical: Not on file  Tobacco Use  . Smoking status: Former Smoker    Packs/day: 0.50    Last attempt to quit: 12/25/1998    Years since quitting: 19.2  . Smokeless tobacco: Never Used  Substance and Sexual Activity  . Alcohol use: No  . Drug use: No  . Sexual activity: Yes    Birth control/protection: None, IUD  Lifestyle  . Physical activity:    Days per week: Not on file    Minutes per session: Not on file  . Stress: Not on file  Relationships  . Social connections:    Talks on phone: Not on file    Gets together:  Not on file    Attends religious service: Not on file    Active member of club or organization: Not on file    Attends meetings of clubs or organizations: Not on file    Relationship status: Not on file  . Intimate partner violence:    Fear of current or ex partner: Not on file    Emotionally abused: Not on file    Physically abused: Not on file    Forced sexual activity: Not on file  Other Topics Concern  . 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   Diet: fruit, veggies, water    ROS  General:  Negative for nexplained weight loss, fever Skin: Negative for new or changing mole, sore that won't heal HEENT: Negative for trouble hearing, trouble seeing, ringing in ears, mouth sores, hoarseness, change in voice, dysphagia. CV:   Negative for chest pain, dyspnea, edema, palpitations Resp: Negative for cough, dyspnea, hemoptysis GI: Negative for nausea, vomiting, diarrhea, constipation, abdominal pain, melena, hematochezia. GU: Negative for dysuria, incontinence, urinary hesitance, hematuria, vaginal or penile discharge, polyuria, sexual difficulty, lumps in testicle or breasts MSK: Negative for muscle cramps or aches, joint pain or swelling Neuro: Negative for headaches, weakness, numbness, dizziness, passing out/fainting Psych: Negative for depression, anxiety, memory problems  Objective  Physical Exam Vitals:   04/03/18 1029  BP: 100/70  Pulse: 66  Temp: 98.2 F (36.8 C)  SpO2: 99%    BP Readings from Last 3 Encounters:  04/03/18 100/70  02/27/17 119/77  01/20/16 112/62   Wt Readings from Last 3 Encounters:  04/03/18 124 lb 9.6 oz (56.5 kg)  02/27/17 117 lb 6.4 oz (53.3 kg)  01/20/16 110 lb 8 oz (50.1 kg)    Physical Exam  Constitutional: No distress.  HENT:  Head: Normocephalic and atraumatic.  Mouth/Throat: Oropharynx is clear and moist.  Eyes: Pupils are equal, round, and reactive to light. Conjunctivae are normal.  Neck: Neck supple.  Cardiovascular: Normal rate, regular rhythm and normal heart sounds.  Pulmonary/Chest: Effort normal and breath sounds normal.  Abdominal: Soft. Bowel sounds are normal. She exhibits no distension. There is no tenderness.  Musculoskeletal: She exhibits no edema.  Lymphadenopathy:    She has no cervical adenopathy.  Neurological: She is alert.  Skin: Skin is warm and dry. She is not diaphoretic.  Left mid thoracic back just adjacent to midline with small skin colored raised nevi, multiple scattered brown appearing nevi on her back  Psychiatric: She has a normal mood and affect.     Assessment/Plan:   Encounter for general adult medical examination with abnormal findings Physical exam completed.  Encouraged diet and exercise.  She will contact her  gynecologist for a gynecologic visit and breast exam.  Lab work as outlined below.  Bicuspid aortic valve Asymptomatic.  Refer back to cardiology.  Multiple nevi Refer to dermatology.   Orders Placed This Encounter  Procedures  . Comp Met (CMET)  . Vitamin D (25 hydroxy)  . Ambulatory referral to Dermatology    Referral Priority:   Routine    Referral Type:   Consultation    Referral Reason:   Specialty Services Required    Requested Specialty:   Dermatology    Number of Visits Requested:   1  . Ambulatory referral to Cardiology    Referral Priority:   Routine    Referral Type:   Consultation    Referral Reason:  Specialty Services Required    Requested Specialty:   Cardiology    Number of Visits Requested:   1    No orders of the defined types were placed in this encounter.    Lindsey Rumps, MD [place]

## 2018-04-03 NOTE — Assessment & Plan Note (Signed)
Asymptomatic.  Refer back to cardiology.

## 2018-04-04 ENCOUNTER — Telehealth: Payer: Self-pay | Admitting: Cardiovascular Disease

## 2018-04-04 DIAGNOSIS — Q231 Congenital insufficiency of aortic valve: Secondary | ICD-10-CM

## 2018-04-04 DIAGNOSIS — Q2381 Bicuspid aortic valve: Secondary | ICD-10-CM

## 2018-04-04 NOTE — Telephone Encounter (Signed)
We can order echo, see how it looks If stable, she can follow up as needed

## 2018-04-04 NOTE — Telephone Encounter (Signed)
Patient seen in 2013 by Dr. Rockey Situ and had an echo .  She says she was told to come back in a few years for a repeat echo   Patient has a referral now from [facility] to check Bicuspid aortic valve and wants to know if she needs an appt or if we can schedule an echo please advise.

## 2018-04-04 NOTE — Telephone Encounter (Signed)
S/w patient. She was agreeable to wait and let us ask Dr Rockey Situ how he would like to proceed. Routing to Dr Rockey Situ for advice.

## 2018-04-05 NOTE — Telephone Encounter (Signed)
Left voicemail message to call back  

## 2018-04-05 NOTE — Telephone Encounter (Signed)
No answer. Left detailed message, ok per DPR, with Dr Donivan Scull recommendations. Said she could call the office to schedule echo at her earliest convenience and if she has any further questions for the nurse we will be glad to answer. Routing to scheduling to reach out to patient.

## 2018-04-08 NOTE — Telephone Encounter (Signed)
Pt scheduled 04/25/18

## 2018-04-25 ENCOUNTER — Other Ambulatory Visit: Payer: Self-pay

## 2018-04-25 ENCOUNTER — Ambulatory Visit (INDEPENDENT_AMBULATORY_CARE_PROVIDER_SITE_OTHER): Payer: BLUE CROSS/BLUE SHIELD

## 2018-04-25 DIAGNOSIS — Q231 Congenital insufficiency of aortic valve: Secondary | ICD-10-CM | POA: Diagnosis not present

## 2018-06-04 DIAGNOSIS — D225 Melanocytic nevi of trunk: Secondary | ICD-10-CM | POA: Diagnosis not present

## 2018-06-04 DIAGNOSIS — Z1283 Encounter for screening for malignant neoplasm of skin: Secondary | ICD-10-CM | POA: Diagnosis not present

## 2018-06-04 DIAGNOSIS — D485 Neoplasm of uncertain behavior of skin: Secondary | ICD-10-CM | POA: Diagnosis not present

## 2018-06-04 DIAGNOSIS — D2270 Melanocytic nevi of unspecified lower limb, including hip: Secondary | ICD-10-CM | POA: Diagnosis not present

## 2018-06-04 DIAGNOSIS — L821 Other seborrheic keratosis: Secondary | ICD-10-CM | POA: Diagnosis not present

## 2018-06-04 DIAGNOSIS — D1800 Hemangioma unspecified site: Secondary | ICD-10-CM | POA: Diagnosis not present

## 2018-08-20 DIAGNOSIS — Z01419 Encounter for gynecological examination (general) (routine) without abnormal findings: Secondary | ICD-10-CM | POA: Diagnosis not present

## 2018-08-20 DIAGNOSIS — Z6822 Body mass index (BMI) 22.0-22.9, adult: Secondary | ICD-10-CM | POA: Diagnosis not present

## 2019-04-07 ENCOUNTER — Encounter: Payer: BLUE CROSS/BLUE SHIELD | Admitting: Family Medicine

## 2019-07-30 ENCOUNTER — Encounter: Payer: BLUE CROSS/BLUE SHIELD | Admitting: Family Medicine

## 2019-08-28 LAB — HM PAP SMEAR: HM Pap smear: NEGATIVE

## 2019-10-03 ENCOUNTER — Ambulatory Visit: Payer: BLUE CROSS/BLUE SHIELD | Admitting: Family Medicine

## 2020-01-05 ENCOUNTER — Ambulatory Visit
Admission: EM | Admit: 2020-01-05 | Discharge: 2020-01-05 | Disposition: A | Discharge status: Home / Self Care | Payer: Managed Care, Other (non HMO) | Attending: Emergency Medicine | Admitting: Emergency Medicine

## 2020-01-05 ENCOUNTER — Other Ambulatory Visit: Payer: Self-pay

## 2020-01-05 DIAGNOSIS — J0140 Acute pansinusitis, unspecified: Secondary | ICD-10-CM | POA: Diagnosis not present

## 2020-01-05 MED ORDER — FLUTICASONE PROPIONATE 50 MCG/ACT NA SUSP: 2.0000 | Freq: Every day | NASAL | 0 refills | Status: DC

## 2020-01-05 MED ORDER — DOXYCYCLINE HYCLATE 100 MG PO CAPS: 100.0000 mg | ORAL_CAPSULE | Freq: Two times a day (BID) | ORAL | 0 refills | Status: AC

## 2020-01-05 MED ORDER — BENZONATATE 200 MG PO CAPS: 200.0000 mg | ORAL_CAPSULE | Freq: Three times a day (TID) | ORAL | 0 refills | Status: DC | PRN

## 2020-01-05 MED ORDER — AEROCHAMBER PLUS MISC: 2 refills | Status: DC

## 2020-01-05 MED ORDER — ALBUTEROL SULFATE HFA 108 (90 BASE) MCG/ACT IN AERS: 1.0000 | INHALATION_SPRAY | RESPIRATORY_TRACT | 0 refills | Status: DC | PRN

## 2020-01-05 NOTE — ED Triage Notes (Signed)
Patient complains of cough, sinus pain and pressure x 1 month. Patient states that she was swabbed for covid on 12/15 and was negative. Patient reports that symptoms have been constant.

## 2020-01-05 NOTE — Discharge Instructions (Addendum)
Finish the antibiotic doxycycline, Flonase, saline nasal irrigation with a Milta Deiters med sinus rinse and distilled water as often as  you want, Mucinex D, Tessalon for cough.  2 puffs from albuterol inhaler with a spacer as needed for cough.  Follow-up here or with PMD as needed.

## 2020-01-05 NOTE — ED Provider Notes (Signed)
HPI  SUBJECTIVE:  Lindsey Juarez is a 39 y.o. female who presents with nasal congestion, purulent nasal drainage, sinus pain and pressure for the past month.  She has questionable upper dental pain.  States that her eyes feel puffy.  She reports postnasal drip and a cough productive of the same material as her nasal congestion.  She states that the cough is worse at night and in the morning.  Reports shortness of breath with coughing only.  She had a negative Covid test in December 1 week after symptoms started.  No fevers, allergy symptoms, wheezing.  No antibiotics in the past month.  No antipyretic in the past 4 to 6 hours.  She has been using a Neti pot, taking Mucinex, Vicks VapoRub, eucalyptus oil, various vitamins and minerals.  The Nettie pot and Mucinex seem to help.  Symptoms are worse with lying down.  States that she is unable to sleep at night secondary to the cough.  She has a history of bicuspid aortic valve.  No history of frequent sinusitis, allergies and diabetes, hypertension, asthma, smoking.  LMP: Has an IUD.  Denies the possibility of being pregnant.  PMD: [facility] in [place].    Past Medical History:  Diagnosis Date  . Abnormal Pap smear   . Bicuspid aortic valve    Last Echo 07/24/14; Bicuspid; mildly thickened leaflets. Mild stenosis.    Past Surgical History:  Procedure Laterality Date  . NO PAST SURGERIES      Family History  Problem Relation Age of Onset  . Goiter Father   . Hypertension Father   . Hypertension Mother        also high chol and hyperthyroid after pregnancy  . Hypothyroidism Sister   . Heart disease Paternal Grandfather        ?cancer  . Coronary artery disease Maternal Grandfather        CABG at 36  . Stroke Maternal Grandmother 40       high chol  . Anesthesia problems Neg Hx     Social History   Tobacco Use  . Smoking status: Former Smoker    Packs/day: 0.50    Quit date: 12/25/1998    Years since quitting: 21.0  . Smokeless  tobacco: Never Used  Substance Use Topics  . Alcohol use: No  . Drug use: No    No current facility-administered medications for this encounter.  Current Outpatient Medications:  .  levonorgestrel (MIRENA) 20 MCG/24HR IUD, 1 each by Intrauterine route once., Disp: , Rfl:  .  albuterol (VENTOLIN HFA) 108 (90 Base) MCG/ACT inhaler, Inhale 1-2 puffs into the lungs every 4 (four) hours as needed for wheezing or shortness of breath., Disp: 18 g, Rfl: 0 .  benzonatate (TESSALON) 200 MG capsule, Take 1 capsule (200 mg total) by mouth 3 (three) times daily as needed for cough., Disp: 30 capsule, Rfl: 0 .  doxycycline (VIBRAMYCIN) 100 MG capsule, Take 1 capsule (100 mg total) by mouth 2 (two) times daily for 7 days., Disp: 14 capsule, Rfl: 0 .  fluticasone (FLONASE) 50 MCG/ACT nasal spray, Place 2 sprays into both nostrils daily., Disp: 16 g, Rfl: 0 .  Spacer/Aero-Holding Chambers (AEROCHAMBER PLUS) inhaler, Use as instructed, Disp: 1 each, Rfl: 2  Allergies  Allergen Reactions  . Sulfonamide Derivatives Hives and Swelling  . Penicillins Rash     ROS  As noted in HPI.   Physical Exam  BP 109/69 (BP Location: Left Arm)   Pulse 90  Temp 98.3 F (36.8 C) (Oral)   Resp 16   Ht 5\' 3"  (1.6 m)   Wt 53.5 kg   SpO2 99%   Breastfeeding No   BMI 20.90 kg/m   Constitutional: Well developed, well nourished, no acute distress Eyes:  EOMI, conjunctiva normal bilaterally HENT: Normocephalic, atraumatic,mucus membranes moist.  Purulent nasal congestion.  Positive frontal sinus tenderness.  Mild maxillary sinus tenderness.  Erythematous, swollen turbinates.  No obvious postnasal drip. Respiratory: Normal inspiratory effort.  Wheezing bilaterally louder on the right than the left.  Mostly clears with coughing. Cardiovascular: Normal rate regular rhythm no murmurs rubs or gallops GI: nondistended skin: No rash, skin intact Musculoskeletal: no deformities Neurologic: Alert & oriented x 3, no  focal neuro deficits Psychiatric: Speech and behavior appropriate   ED Course   Medications - No data to display  No orders of the defined types were placed in this encounter.   No results found for this or any previous visit (from the past 24 hour(s)). No results found.  ED Clinical Impression  1. Acute non-recurrent pansinusitis      ED Assessment/Plan  Patient with a sinusitis.  Doubt Covid infection especially since she had negative test 1 week after symptoms started.  Will send home with doxycycline, Flonase, saline nasal irrigation with a Milta Deiters med sinus rinse and distilled water as often as she wants, Mucinex D, Tessalon.  2 puffs from albuterol inhaler with a spacer as needed for cough or wheezing.  Follow-up here or with PMD as needed.  Discussed  MDM, treatment plan, and plan for follow-up with patient.. patient agrees with plan.    Meds ordered this encounter  Medications  . doxycycline (VIBRAMYCIN) 100 MG capsule    Sig: Take 1 capsule (100 mg total) by mouth 2 (two) times daily for 7 days.    Dispense:  14 capsule    Refill:  0  . fluticasone (FLONASE) 50 MCG/ACT nasal spray    Sig: Place 2 sprays into both nostrils daily.    Dispense:  16 g    Refill:  0  . benzonatate (TESSALON) 200 MG capsule    Sig: Take 1 capsule (200 mg total) by mouth 3 (three) times daily as needed for cough.    Dispense:  30 capsule    Refill:  0  . albuterol (VENTOLIN HFA) 108 (90 Base) MCG/ACT inhaler    Sig: Inhale 1-2 puffs into the lungs every 4 (four) hours as needed for wheezing or shortness of breath.    Dispense:  18 g    Refill:  0  . Spacer/Aero-Holding Chambers (AEROCHAMBER PLUS) inhaler    Sig: Use as instructed    Dispense:  1 each    Refill:  2    *This clinic note was created using Dragon dictation software. Therefore, there may be occasional mistakes despite careful proofreading.   ?    Melynda Ripple, MD 01/06/20 1050

## 2020-01-16 ENCOUNTER — Encounter: Payer: Self-pay | Admitting: Emergency Medicine

## 2020-01-16 ENCOUNTER — Ambulatory Visit
Admission: EM | Admit: 2020-01-16 | Discharge: 2020-01-16 | Disposition: A | Discharge status: Home / Self Care | Payer: Managed Care, Other (non HMO) | Attending: Family Medicine | Admitting: Family Medicine

## 2020-01-16 ENCOUNTER — Ambulatory Visit (INDEPENDENT_AMBULATORY_CARE_PROVIDER_SITE_OTHER): Payer: Managed Care, Other (non HMO)

## 2020-01-16 ENCOUNTER — Other Ambulatory Visit: Payer: Self-pay

## 2020-01-16 DIAGNOSIS — R062 Wheezing: Secondary | ICD-10-CM | POA: Diagnosis not present

## 2020-01-16 DIAGNOSIS — R05 Cough: Secondary | ICD-10-CM

## 2020-01-16 DIAGNOSIS — R053 Chronic cough: Secondary | ICD-10-CM

## 2020-01-16 DIAGNOSIS — B379 Candidiasis, unspecified: Secondary | ICD-10-CM

## 2020-01-16 MED ORDER — PREDNISONE 10 MG PO TABS: ORAL_TABLET | ORAL | 0 refills | Status: DC

## 2020-01-16 MED ORDER — LEVALBUTEROL TARTRATE 45 MCG/ACT IN AERO: 1.0000 | INHALATION_SPRAY | Freq: Four times a day (QID) | RESPIRATORY_TRACT | 1 refills | Status: DC | PRN

## 2020-01-16 MED ORDER — HYDROCOD POLST-CPM POLST ER 10-8 MG/5ML PO SUER: 5.0000 mL | Freq: Two times a day (BID) | ORAL | 0 refills | Status: DC | PRN

## 2020-01-16 MED ORDER — FLUCONAZOLE 150 MG PO TABS: 150.0000 mg | ORAL_TABLET | Freq: Once | ORAL | 0 refills | Status: AC

## 2020-01-16 NOTE — Discharge Instructions (Signed)
Medication as directed. ° °Take care ° °Dr. Jenipher Havel  °

## 2020-01-16 NOTE — ED Provider Notes (Signed)
[facility]    CSN: [id number] Arrival date & time: 01/16/20  1140      History   Chief Complaint Chief Complaint  Patient presents with   Cough   HPI  39 year old female presents with cough.   Patient reports she has had ongoing cough since the middle of December.  She reports productive cough and congestion.  She has recently been seen here and was placed on antibiotics and was also given cough medication and an inhaler.  She states that she has had improvement in her congestion but continues to have a productive cough.  Productive of yellow sputum.  Denies fever.  Denies shortness of breath.  No relieving factors.  No other associated symptoms.   Additionally, patient reports that she believes she has a yeast infection after recent antibiotic course.  She is requesting medication today.  PMH, Surgical Hx, Family Hx, Social History reviewed and updated as below.  Past Medical History:  Diagnosis Date   Abnormal Pap smear    Bicuspid aortic valve    Last Echo 07/24/14; Bicuspid; mildly thickened leaflets. Mild stenosis.   Patient Active Problem List   Diagnosis Date Noted   Encounter for general adult medical examination with abnormal findings 04/03/2018   Multiple nevi 04/03/2018   Bicuspid aortic valve 05/06/2009   Past Surgical History:  Procedure Laterality Date   NO PAST SURGERIES     OB History    Gravida  2   Para  2   Term  2   Preterm      AB      Living  2     SAB      TAB      Ectopic      Multiple      Live Births  2           Home Medications    Prior to Admission medications   Medication Sig Start Date End Date Taking? Authorizing Provider  levonorgestrel (MIRENA) 20 MCG/24HR IUD 1 each by Intrauterine route once.   Yes [provider]  Multiple Vitamin (MULTIVITAMIN) tablet Take 1 tablet by mouth daily.   Yes [provider]  chlorpheniramine-HYDROcodone (TUSSIONEX PENNKINETIC ER) 10-8  MG/5ML SUER Take 5 mLs by mouth every 12 (twelve) hours as needed. 01/16/20   Coral Spikes, DO  fluconazole (DIFLUCAN) 150 MG tablet Take 1 tablet (150 mg total) by mouth once for 1 dose. Repeat dose in 72 hours. 01/16/20 01/16/20  Coral Spikes, DO  levalbuterol [facility] HFA) 45 MCG/ACT inhaler Inhale 1-2 puffs into the lungs every 6 (six) hours as needed for wheezing or shortness of breath. 01/16/20   Coral Spikes, DO  predniSONE (DELTASONE) 10 MG tablet 50 mg daily x 2 days, then 40 mg daily x 2 days, then 30 mg daily x 2 days, then 20 mg daily x 2 days, then 10 mg daily x 2 days. 01/16/20   Coral Spikes, DO  albuterol (VENTOLIN HFA) 108 (90 Base) MCG/ACT inhaler Inhale 1-2 puffs into the lungs every 4 (four) hours as needed for wheezing or shortness of breath. 01/05/20 01/16/20  Melynda Ripple, MD  fluticasone (FLONASE) 50 MCG/ACT nasal spray Place 2 sprays into both nostrils daily. 01/05/20 01/16/20  Melynda Ripple, MD    Family History Family History  Problem Relation Age of Onset   Goiter Father    Hypertension Father    Hypertension Mother  also high chol and hyperthyroid after pregnancy   Hypothyroidism Sister    Heart disease Paternal Grandfather        ?cancer   Coronary artery disease Maternal Grandfather        CABG at 50   Stroke Maternal Grandmother 40       high chol   Anesthesia problems Neg Hx     Social History Social History   Tobacco Use   Smoking status: Former Smoker    Packs/day: 0.50    Quit date: 12/25/1998    Years since quitting: 21.0   Smokeless tobacco: Never Used  Substance Use Topics   Alcohol use: No   Drug use: No     Allergies   Sulfonamide derivatives and Penicillins   Review of Systems Review of Systems  Constitutional: Negative for fever.  HENT: Positive for congestion.   Respiratory: Positive for cough.    Physical Exam Triage Vital Signs ED Triage Vitals  Enc Vitals Group     BP 01/16/20 1213 111/79      Pulse Rate 01/16/20 1213 67     Resp 01/16/20 1213 14     Temp 01/16/20 1213 98 F (36.7 C)     Temp Source 01/16/20 1213 Oral     SpO2 01/16/20 1213 99 %     Weight 01/16/20 1210 118 lb (53.5 kg)     Height 01/16/20 1210 5\' 2"  (1.575 m)     Head Circumference --      Peak Flow --      Pain Score 01/16/20 1210 0     Pain Loc --      Pain Edu? --      Excl. in [place]? --    Updated Vital Signs BP 111/79 (BP Location: Left Arm)    Pulse 67    Temp 98 F (36.7 C) (Oral)    Resp 14    Ht 5\' 2"  (1.575 m)    Wt 53.5 kg    SpO2 99%    BMI 21.58 kg/m   Visual Acuity Right Eye Distance:   Left Eye Distance:   Bilateral Distance:    Right Eye Near:   Left Eye Near:    Bilateral Near:     Physical Exam Vitals and nursing note reviewed.  Constitutional:      General: She is not in acute distress.    Appearance: Normal appearance. She is not ill-appearing.  HENT:     Head: Normocephalic and atraumatic.  Eyes:     General:        Right eye: No discharge.        Left eye: No discharge.     Conjunctiva/sclera: Conjunctivae normal.  Cardiovascular:     Rate and Rhythm: Normal rate and regular rhythm.     Heart sounds: No murmur.  Pulmonary:     Effort: Pulmonary effort is normal.     Breath sounds: Normal breath sounds.  Skin:    General: Skin is warm.     Findings: No rash.  Neurological:     Mental Status: She is alert.  Psychiatric:        Mood and Affect: Mood normal.        Behavior: Behavior normal.    UC Treatments / Results  Labs (all labs ordered are listed, but only abnormal results are displayed) Labs Reviewed - No data to display  EKG   Radiology DG Chest 2 View  Result Date: 01/16/2020 CLINICAL DATA:  Cough and congestion for several weeks EXAM: CHEST - 2 VIEW COMPARISON:  None. FINDINGS: The heart size and mediastinal contours are within normal limits. Both lungs are clear. The visualized skeletal structures are unremarkable. IMPRESSION: No active  cardiopulmonary disease. Electronically Signed   By: Inez Catalina M.D.   On: 01/16/2020 13:18    Procedures Procedures (including critical care time)  Medications Ordered in UC Medications - No data to display  Initial Impression / Assessment and Plan / UC Course  I have reviewed the triage vital signs and the nursing notes.  Pertinent labs & imaging results that were available during my care of the patient were reviewed by me and considered in my medical decision making (see chart for details).    39 year old female presents with chronic cough and wheezing.  Chest x-ray negative.  Treating with Xopenex and prednisone.  Test next for cough.  Diflucan given.  Final Clinical Impressions(s) / UC Diagnoses   Final diagnoses:  Chronic cough  Wheezing  Yeast infection     Discharge Instructions     Medication as directed.  Take care  Dr. Lacinda Axon     ED Prescriptions    Medication Sig Dispense Auth. Provider   levalbuterol [facility] HFA) 45 MCG/ACT inhaler Inhale 1-2 puffs into the lungs every 6 (six) hours as needed for wheezing or shortness of breath. 1 Inhaler Rock Sobol G, DO   predniSONE (DELTASONE) 10 MG tablet 50 mg daily x 2 days, then 40 mg daily x 2 days, then 30 mg daily x 2 days, then 20 mg daily x 2 days, then 10 mg daily x 2 days. 30 tablet Leonell Lobdell G, DO   fluconazole (DIFLUCAN) 150 MG tablet Take 1 tablet (150 mg total) by mouth once for 1 dose. Repeat dose in 72 hours. 2 tablet [place], Emmalynn Pinkham G, DO   chlorpheniramine-HYDROcodone (TUSSIONEX PENNKINETIC ER) 10-8 MG/5ML SUER Take 5 mLs by mouth every 12 (twelve) hours as needed. 115 mL Coral Spikes, DO     PDMP not reviewed this encounter.   Coral Spikes, [place] 01/16/20 1442

## 2020-01-16 NOTE — ED Triage Notes (Signed)
Patient c/o cough and chest congestion off and since December.  Patient was not tested for COVID when her cough first started. Patient denies fevers.  Patient reports coughing up yellow sputum.  Patient states that she was treated with antibiotic over a week ago.

## 2020-02-13 ENCOUNTER — Ambulatory Visit
Admission: EM | Admit: 2020-02-13 | Discharge: 2020-02-13 | Disposition: A | Discharge status: Home / Self Care | Payer: Managed Care, Other (non HMO) | Attending: Family Medicine | Admitting: Family Medicine

## 2020-02-13 ENCOUNTER — Other Ambulatory Visit: Payer: Self-pay

## 2020-02-13 ENCOUNTER — Encounter: Payer: Self-pay | Admitting: Emergency Medicine

## 2020-02-13 DIAGNOSIS — R05 Cough: Secondary | ICD-10-CM | POA: Diagnosis not present

## 2020-02-13 DIAGNOSIS — R059 Cough, unspecified: Secondary | ICD-10-CM

## 2020-02-13 DIAGNOSIS — J329 Chronic sinusitis, unspecified: Secondary | ICD-10-CM | POA: Diagnosis not present

## 2020-02-13 MED ORDER — CEFDINIR 300 MG PO CAPS: 300.0000 mg | ORAL_CAPSULE | Freq: Two times a day (BID) | ORAL | 0 refills | Status: AC

## 2020-02-13 MED ORDER — PREDNISONE 20 MG PO TABS: ORAL_TABLET | ORAL | 0 refills | Status: DC

## 2020-02-13 MED ORDER — FLUCONAZOLE 150 MG PO TABS: ORAL_TABLET | ORAL | 0 refills | Status: DC

## 2020-02-13 NOTE — Discharge Instructions (Signed)
I am trying a different antibiotic to treat your sinuses.  Push fluids to ensure adequate hydration and keep secretions thin.  Daily flonase.  12 days of steroids to help with cough and your sinuses.  I have provided yeast infection treatment if needed at completion of antibiotics.  Mucinex as an expectorant may be helpful.  If symptoms worsen or do not improve in the next week to return to be seen or to follow up with your PCP.

## 2020-02-13 NOTE — ED Triage Notes (Signed)
Patient states that she has had this cough since December.  Patient states that she was given Prednisone at her visit in January and her cough got better but when she finished her prednisone that cough got worse again.  Patient denies fevers.

## 2020-02-13 NOTE — ED Provider Notes (Signed)
[facility]    CSN: [id number] Arrival date & time: 02/13/20  1657      History   Chief Complaint Chief Complaint  Patient presents with  . Cough    HPI Lindsey Juarez is a 39 y.o. female.   Lindsey Juarez presents with complaints of persistent cough. Cough started in December. Was seen in January and was given antibiotics, which didn't help at all. Was seen again 1/22, chest xray was completed and negative, and steroid was given. This did seem to help, but once it was done cough has returned. Worse in the morning and at night. Sometimes it is productive. Endorses post nasal drip. No fever. Some shortness of breath with coughing episodes. Occasionally will feel lightheadedness with this as well. Denies any previous similar. Her husband was ill, was also seen in January at the same time she was seen, found to have bilateral pneumonia. He was negative for covid. She has not had any covid testing since December, although was negative then, when symptoms started. No gi symptoms. No ear pain or sore throat. Ears feels congested. Some facial pressure. Has used neti pot which has helped temporarily. No other medications for symptoms. Has been taking Vitamin c and zinc supplementations. History  Of bicuspid aortic valve with mild stenoiss.    ROS per HPI, negative if not otherwise mentioned.      Past Medical History:  Diagnosis Date  . Abnormal Pap smear   . Bicuspid aortic valve    Last Echo 07/24/14; Bicuspid; mildly thickened leaflets. Mild stenosis.    Patient Active Problem List   Diagnosis Date Noted  . Encounter for general adult medical examination with abnormal findings 04/03/2018  . Multiple nevi 04/03/2018  . Bicuspid aortic valve 05/06/2009    Past Surgical History:  Procedure Laterality Date  . NO PAST SURGERIES      OB History    Gravida  2   Para  2   Term  2   Preterm      AB      Living  2     SAB      TAB      Ectopic      Multiple      Live Births  2            Home Medications    Prior to Admission medications   Medication Sig Start Date End Date Taking? Authorizing Provider  levonorgestrel (MIRENA) 20 MCG/24HR IUD 1 each by Intrauterine route once.   Yes [provider]  Multiple Vitamin (MULTIVITAMIN) tablet Take 1 tablet by mouth daily.   Yes [provider]  cefdinir (OMNICEF) 300 MG capsule Take 1 capsule (300 mg total) by mouth 2 (two) times daily for 10 days. 02/13/20 02/23/20  Zigmund Gottron, NP  fluconazole (DIFLUCAN) 150 MG tablet May take 1 tab at completion of antibiotics if needed, repeat in three days a second pill, if needed 02/13/20   Augusto Gamble B, NP  predniSONE (DELTASONE) 20 MG tablet 3-3-3-3-2-2-2-2-1-1-1-1 02/13/20   Zigmund Gottron, NP  albuterol (VENTOLIN HFA) 108 (90 Base) MCG/ACT inhaler Inhale 1-2 puffs into the lungs every 4 (four) hours as needed for wheezing or shortness of breath. 01/05/20 01/16/20  Melynda Ripple, MD  fluticasone (FLONASE) 50 MCG/ACT nasal spray Place 2 sprays into both nostrils daily. 01/05/20 01/16/20  Melynda Ripple, MD  levalbuterol [facility] HFA) 45 MCG/ACT inhaler Inhale 1-2 puffs into the lungs every 6 (six) hours as  needed for wheezing or shortness of breath. 01/16/20 02/13/20  Coral Spikes, DO    Family History Family History  Problem Relation Age of Onset  . Goiter Father   . Hypertension Father   . Hypertension Mother        also high chol and hyperthyroid after pregnancy  . Hypothyroidism Sister   . Heart disease Paternal Grandfather        ?cancer  . Coronary artery disease Maternal Grandfather        CABG at 10  . Stroke Maternal Grandmother 40       high chol  . Anesthesia problems Neg Hx     Social History Social History   Tobacco Use  . Smoking status: Former Smoker    Packs/day: 0.50    Quit date: 12/25/1998    Years since quitting: 21.1  . Smokeless tobacco: Never Used  Substance Use Topics  .  Alcohol use: No  . Drug use: No     Allergies   Sulfonamide derivatives and Penicillins   Review of Systems Review of Systems   Physical Exam Triage Vital Signs ED Triage Vitals  Enc Vitals Group     BP 02/13/20 1740 99/69     Pulse Rate 02/13/20 1740 80     Resp 02/13/20 1740 14     Temp 02/13/20 1740 98.5 F (36.9 C)     Temp Source 02/13/20 1740 Oral     SpO2 02/13/20 1740 99 %     Weight 02/13/20 1737 115 lb (52.2 kg)     Height 02/13/20 1737 5' 2.5" (1.588 m)     Head Circumference --      Peak Flow --      Pain Score 02/13/20 1737 0     Pain Loc --      Pain Edu? --      Excl. in [place]? --    No data found.  Updated Vital Signs BP 99/69 (BP Location: Right Arm)   Pulse 80   Temp 98.5 F (36.9 C) (Oral)   Resp 14   Ht 5' 2.5" (1.588 m)   Wt 115 lb (52.2 kg)   SpO2 99%   BMI 20.70 kg/m   Visual Acuity Right Eye Distance:   Left Eye Distance:   Bilateral Distance:    Right Eye Near:   Left Eye Near:    Bilateral Near:     Physical Exam Vitals and nursing note reviewed.  Constitutional:      General: She is not in acute distress.    Appearance: She is well-developed.  HENT:     Nose: Mucosal edema and rhinorrhea present.     Mouth/Throat:     Mouth: Mucous membranes are moist.  Cardiovascular:     Rate and Rhythm: Normal rate.  Pulmonary:     Effort: Pulmonary effort is normal. No respiratory distress.     Breath sounds: Normal breath sounds. No wheezing or rhonchi.     Comments: Occasional dry cough noted; no work of breathing  Skin:    General: Skin is warm and dry.  Neurological:     Mental Status: She is alert and oriented to person, place, and time.      UC Treatments / Results  Labs (all labs ordered are listed, but only abnormal results are displayed) Labs Reviewed - No data to display  EKG   Radiology No results found.  Procedures Procedures (including critical care time)  Medications Ordered in UC  Medications - No  data to display  Initial Impression / Assessment and Plan / UC Course  I have reviewed the triage vital signs and the nursing notes.  Pertinent labs & imaging results that were available during my care of the patient were reviewed by me and considered in my medical decision making (see chart for details).     Lingering sinus symptoms and cough. Had mildly improved and then worsened again. Vitals stable at this time. Normal chest xray 1/22, deferred repeat today. Opted to try repeat coverage for sinusitis, with cefdinir (has tolerated in the past) as well as a longer steroid taper to help with cough. Return precautions provided. If symptoms worsen or do not improve in the next week to return to be seen or to follow up with PCP.  Patient verbalized understanding and agreeable to plan.   Final Clinical Impressions(s) / UC Diagnoses   Final diagnoses:  Sinusitis, unspecified chronicity, unspecified location  Cough     Discharge Instructions     I am trying a different antibiotic to treat your sinuses.  Push fluids to ensure adequate hydration and keep secretions thin.  Daily flonase.  12 days of steroids to help with cough and your sinuses.  I have provided yeast infection treatment if needed at completion of antibiotics.  Mucinex as an expectorant may be helpful.  If symptoms worsen or do not improve in the next week to return to be seen or to follow up with your PCP.      ED Prescriptions    Medication Sig Dispense Auth. Provider   cefdinir (OMNICEF) 300 MG capsule Take 1 capsule (300 mg total) by mouth 2 (two) times daily for 10 days. 20 capsule Augusto Gamble B, NP   fluconazole (DIFLUCAN) 150 MG tablet May take 1 tab at completion of antibiotics if needed, repeat in three days a second pill, if needed 2 tablet Augusto Gamble B, NP   predniSONE (DELTASONE) 20 MG tablet 3-3-3-3-2-2-2-2-1-1-1-1 24 tablet Zigmund Gottron, NP     PDMP not reviewed this encounter.   Zigmund Gottron, NP 02/13/20 1818

## 2020-08-29 ENCOUNTER — Encounter: Payer: Self-pay | Admitting: Family Medicine

## 2020-08-31 NOTE — Telephone Encounter (Signed)
If covid positive, need to know when symptoms began.  She may qualify for antibody infusion.  See if any sob, chest pain or tightness.  Checking pulse ox, etc?  Need to see where checked.  May need referral for antibody infusion.  Any acute symptoms, - needs to be evaluated.

## 2020-08-31 NOTE — Telephone Encounter (Signed)
Left message to call back  

## 2020-09-01 NOTE — Telephone Encounter (Signed)
Routing to you to follow up

## 2020-09-02 ENCOUNTER — Telehealth: Payer: Self-pay

## 2020-09-02 NOTE — Telephone Encounter (Signed)
I recommend given exposure and symptoms, evaluation.  Can discuss treatment, f/u and antibody infusion, etc.

## 2020-09-02 NOTE — Telephone Encounter (Signed)
Called and lvm for the patient to call back fro more information.  Shatera Rennert,cma

## 2020-09-02 NOTE — Telephone Encounter (Signed)
Lvm for patient to callback for a virtual visit.  Gustavus Haskin,cma

## 2020-09-24 NOTE — Telephone Encounter (Signed)
LVM and patient never returned a call back.  Reannah Totten,cma

## 2020-11-16 ENCOUNTER — Other Ambulatory Visit: Payer: Self-pay

## 2020-11-16 ENCOUNTER — Encounter: Payer: Self-pay | Admitting: Family Medicine

## 2020-11-16 ENCOUNTER — Ambulatory Visit (INDEPENDENT_AMBULATORY_CARE_PROVIDER_SITE_OTHER): Payer: Managed Care, Other (non HMO) | Admitting: Family Medicine

## 2020-11-16 VITALS — BP 110/70 | HR 84 | Temp 98.5°F | Ht 63.0 in | Wt 130.0 lb

## 2020-11-16 DIAGNOSIS — E559 Vitamin D deficiency, unspecified: Secondary | ICD-10-CM

## 2020-11-16 DIAGNOSIS — Z130 Encounter for screening for diseases of the blood and blood-forming organs and certain disorders involving the immune mechanism: Secondary | ICD-10-CM

## 2020-11-16 DIAGNOSIS — Z0001 Encounter for general adult medical examination with abnormal findings: Secondary | ICD-10-CM | POA: Diagnosis not present

## 2020-11-16 DIAGNOSIS — Z1329 Encounter for screening for other suspected endocrine disorder: Secondary | ICD-10-CM | POA: Diagnosis not present

## 2020-11-16 DIAGNOSIS — Q231 Congenital insufficiency of aortic valve: Secondary | ICD-10-CM

## 2020-11-16 DIAGNOSIS — Z13220 Encounter for screening for lipoid disorders: Secondary | ICD-10-CM

## 2020-11-16 LAB — COMPREHENSIVE METABOLIC PANEL
ALT: 19 U/L (ref 0–35)
AST: 17 U/L (ref 0–37)
Albumin: 4.4 g/dL (ref 3.5–5.2)
Alkaline Phosphatase: 44 U/L (ref 39–117)
BUN: 10 mg/dL (ref 6–23)
CO2: 28 mEq/L (ref 19–32)
Calcium: 9.2 mg/dL (ref 8.4–10.5)
Chloride: 104 mEq/L (ref 96–112)
Creatinine, Ser: 0.69 mg/dL (ref 0.40–1.20)
GFR: 108.16 mL/min (ref >60.00)
Glucose, Bld: 71 mg/dL (ref 70–99)
Potassium: 4 mEq/L (ref 3.5–5.1)
Sodium: 138 mEq/L (ref 135–145)
Total Bilirubin: 0.7 mg/dL (ref 0.2–1.2)
Total Protein: 7 g/dL (ref 6.0–8.3)

## 2020-11-16 LAB — CBC
HCT: 37.1 % (ref 36.0–46.0)
Hemoglobin: 12.4 g/dL (ref 12.0–15.0)
MCHC: 33.6 g/dL (ref 30.0–36.0)
MCV: 93.8 fl (ref 78.0–100.0)
Platelets: 268 10*3/uL (ref 150.0–400.0)
RBC: 3.95 Mil/uL (ref 3.87–5.11)
RDW: 13.4 % (ref 11.5–15.5)
WBC: 5 10*3/uL (ref 4.0–10.5)

## 2020-11-16 LAB — LIPID PANEL
Cholesterol: 141 mg/dL (ref 0–200)
HDL: 66.7 mg/dL (ref >39.00)
LDL Cholesterol: 67 mg/dL (ref 0–99)
NonHDL: 74.2
Total CHOL/HDL Ratio: 2
Triglycerides: 34 mg/dL (ref 0.0–149.0)
VLDL: 6.8 mg/dL (ref 0.0–40.0)

## 2020-11-16 LAB — VITAMIN D 25 HYDROXY (VIT D DEFICIENCY, FRACTURES): VITD: 43.63 ng/mL (ref 30.00–100.00)

## 2020-11-16 LAB — TSH: TSH: 2.99 u[IU]/mL (ref 0.35–4.50)

## 2020-11-16 NOTE — Patient Instructions (Signed)
Nice to see you. We will get labs on you today.  Please continue with your healthy diet and exercise.  We will let you know when we hear back from cardiology.

## 2020-11-16 NOTE — Assessment & Plan Note (Signed)
Message sent to cardiologist to see if she needs further follow-up on this.

## 2020-11-16 NOTE — Progress Notes (Signed)
Tommi Rumps, MD Phone: [phone]  Lindsey Juarez is a 39 y.o. female who presents today for CPE.  Diet: healthy, not much junk food, no soda or sweet tea Exercise: taekwondo 3 times per week Pap smear: due, see GYN in a couple of weeks Mammogram: through GYN in a couple of weeks Family history-  Colon cancer: no  Breast cancer: great aunt  Ovarian cancer: no Menses: IUD Vaccines-   Flu: declines  Tetanus: UTD  COVID19: declines HIV screening: UTD Hep C Screening: declines Tobacco use: no Alcohol use: occasional 1 drink Illicit Drug use: no Dentist: yes Ophthalmology: yes Had COVID in September and has recovered well.  Has a bicuspid aortic valve. Wonders about when she needs follow-up.    Active Ambulatory Problems    Diagnosis Date Noted  . Bicuspid aortic valve 05/06/2009  . Encounter for general adult medical examination with abnormal findings 04/03/2018  . Multiple nevi 04/03/2018   Resolved Ambulatory Problems    Diagnosis Date Noted  . MIGRAINE WITH AURA 05/06/2009  . ALLERGIC RHINITIS WITH CONJUNCTIVITIS 06/07/2010  . TACHYCARDIA 06/13/2010  . PAP SMEAR, ABNORMAL, ASCUS 05/06/2009  . Syncope 04/06/2012  . PP care - s/p NVB 4/12 04/06/2012  . Indication for care or intervention in labor or delivery 09/24/2014  . Postpartum care following vaginal delivery (10/1) 09/24/2014  . Postpartum hemorrhage 09/24/2014  . Otitis media 01/20/2016  . Bleeding from ear 01/20/2016  . Annual physical exam 02/27/2017   Past Medical History:  Diagnosis Date  . Abnormal Pap smear     Family History  Problem Relation Age of Onset  . Goiter Father   . Hypertension Father   . Hypertension Mother        also high chol and hyperthyroid after pregnancy  . Hypothyroidism Sister   . Heart disease Paternal Grandfather        ?cancer  . Coronary artery disease Maternal Grandfather        CABG at 37  . Stroke Maternal Grandmother 40       high chol  . Anesthesia  problems Neg Hx     Social History   Socioeconomic History  . Marital status: Married    Spouse name: Not on file  . Number of children: Not on file  . Years of education: Not on file  . Highest education level: Not on file  Occupational History  . Not on file  Tobacco Use  . Smoking status: Former Smoker    Packs/day: 0.50    Quit date: 12/25/1998    Years since quitting: 21.9  . Smokeless tobacco: Never Used  Vaping Use  . Vaping Use: Never used  Substance and Sexual Activity  . Alcohol use: No  . Drug use: No  . Sexual activity: Yes    Birth control/protection: None, I.U.D.  Other Topics Concern  . 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   Diet: fruit, veggies, water   Social Determinants of Health   Financial Resource Strain:   . Difficulty of Paying Living Expenses: Not on file  Food Insecurity:   . Worried About Charity fundraiser in the Last Year: Not on file  . Ran Out of Food in the Last Year: Not on file  Transportation Needs:   . Lack of Transportation (Medical): Not on file  . Lack of Transportation (Non-Medical): Not on file  Physical Activity:   . Days of Exercise  per Week: Not on file  . Minutes of Exercise per Session: Not on file  Stress:   . Feeling of Stress : Not on file  Social Connections:   . Frequency of Communication with Friends and Family: Not on file  . Frequency of Social Gatherings with Friends and Family: Not on file  . Attends Religious Services: Not on file  . Active Member of Clubs or Organizations: Not on file  . Attends Archivist Meetings: Not on file  . Marital Status: Not on file  Intimate Partner Violence:   . Fear of Current or Ex-Partner: Not on file  . Emotionally Abused: Not on file  . Physically Abused: Not on file  . Sexually Abused: Not on file    ROS  General:  Negative for nexplained weight loss, fever Skin: Negative for new or  changing mole, sore that won't heal HEENT: Negative for trouble hearing, trouble seeing, ringing in ears, mouth sores, hoarseness, change in voice, dysphagia. CV:  Negative for chest pain, dyspnea, edema, palpitations Resp: Negative for cough, dyspnea, hemoptysis GI: Negative for nausea, vomiting, diarrhea, constipation, abdominal pain, melena, hematochezia. GU: Negative for dysuria, incontinence, urinary hesitance, hematuria, vaginal or penile discharge, polyuria, sexual difficulty, lumps in testicle or breasts MSK: Negative for muscle cramps or aches, joint pain or swelling Neuro: Negative for headaches, weakness, numbness, dizziness, passing out/fainting Psych: Negative for depression, anxiety, memory problems  Objective  Physical Exam Vitals:   11/16/20 0845  BP: 110/70  Pulse: 84  Temp: 98.5 F (36.9 C)  SpO2: 99%    BP Readings from Last 3 Encounters:  11/16/20 110/70  02/13/20 99/69  01/16/20 111/79   Wt Readings from Last 3 Encounters:  11/16/20 130 lb (59 kg)  02/13/20 115 lb (52.2 kg)  01/16/20 118 lb (53.5 kg)    Physical Exam Constitutional:      General: She is not in acute distress.    Appearance: She is not diaphoretic.  HENT:     Head: Normocephalic and atraumatic.  Eyes:     Conjunctiva/sclera: Conjunctivae normal.     Pupils: Pupils are equal, round, and reactive to light.  Cardiovascular:     Rate and Rhythm: Normal rate and regular rhythm.     Heart sounds: Normal heart sounds.  Pulmonary:     Effort: Pulmonary effort is normal.     Breath sounds: Normal breath sounds.  Abdominal:     General: Bowel sounds are normal. There is no distension.     Palpations: Abdomen is soft.     Tenderness: There is no abdominal tenderness. There is no guarding or rebound.  Musculoskeletal:     Right lower leg: No edema.     Left lower leg: No edema.  Lymphadenopathy:     Cervical: No cervical adenopathy.  Skin:    General: Skin is warm and dry.    Neurological:     Mental Status: She is alert.  Psychiatric:        Mood and Affect: Mood normal.      Assessment/Plan:   Problem List Items Addressed This Visit    Bicuspid aortic valve    Message sent to cardiologist to see if she needs further follow-up on this.       Encounter for general adult medical examination with abnormal findings    CPE completed. Encouraged continued healthy diet and exercise. She will se GYN for pap, breast exam, and mammogram. Discussed colon cancer screening starting age 57. She  declines the flu vaccine and COVID19 vaccine. Encouraged her to consider getting these. Declines hep C testing. Labs as outlined.        Other Visit Diagnoses    Lipid screening    -  Primary   Relevant Orders   Comp Met (CMET)   Lipid panel   Screening for deficiency anemia       Relevant Orders   CBC   Thyroid disorder screen       Relevant Orders   TSH   Vitamin D deficiency       Relevant Orders   Vitamin D (25 hydroxy)      This visit occurred during the SARS-CoV-2 public health emergency.  Safety protocols were in place, including screening questions prior to the visit, additional usage of staff PPE, and extensive cleaning of exam room while observing appropriate contact time as indicated for disinfecting solutions.    Tommi Rumps, MD [place]

## 2020-11-16 NOTE — Assessment & Plan Note (Signed)
CPE completed. Encouraged continued healthy diet and exercise. She will se GYN for pap, breast exam, and mammogram. Discussed colon cancer screening starting age 41. She declines the flu vaccine and COVID19 vaccine. Encouraged her to consider getting these. Declines hep C testing. Labs as outlined.

## 2020-11-29 ENCOUNTER — Telehealth: Payer: Self-pay | Admitting: Family Medicine

## 2020-11-29 NOTE — Telephone Encounter (Signed)
-----   Message from Minna Merritts, MD sent at 11/24/2020  1:37 PM EST ----- Regarding: RE: need for f/u echo? She does not need to be seen Just can do periodic echo If murmurs not particularly intense (1 up to 2/6), Could probably defer echo for another year.  Really has not been moving and had minimal gradient 2 years ago which was stable.  I am not expecting rapid progression anytime soon Thx TG ----- Message ----- From: Leone Haven, MD Sent: 11/16/2020   8:55 AM EST To: Minna Merritts, MD Subject: need for f/u echo?                             Hi Tim,   I saw Mrs Crook for a physical today. She wondered if she needed a follow-up echo for her bicuspid aortic valve. She does not have a murmur on exam at this time and her last echo in 2019 was stable. Does she need follow-up with you or another echo? Or do we just monitor for development of symptoms or a murmur at this point? Thanks for your help.  Randall Hiss

## 2020-11-29 NOTE — Telephone Encounter (Signed)
Can you let the patient know that I heard back from Dr. Rockey Situ?  He advised that the patient did not need to be seen by him and just needed a periodic echo.  He noted that we could defer the echo for another year.

## 2020-11-29 NOTE — Telephone Encounter (Signed)
Spoke with patient and she is aware of below message.

## 2020-12-09 ENCOUNTER — Other Ambulatory Visit: Payer: Self-pay | Admitting: Obstetrics

## 2020-12-09 DIAGNOSIS — N6489 Other specified disorders of breast: Secondary | ICD-10-CM

## 2021-01-19 ENCOUNTER — Ambulatory Visit: Payer: BLUE CROSS/BLUE SHIELD | Admitting: Dermatology

## 2021-01-20 ENCOUNTER — Ambulatory Visit
Admission: RE | Admit: 2021-01-20 | Discharge: 2021-01-20 | Disposition: A | Discharge status: Home / Self Care | Payer: Managed Care, Other (non HMO) | Source: Ambulatory Visit | Attending: Obstetrics | Admitting: Obstetrics

## 2021-01-20 ENCOUNTER — Other Ambulatory Visit: Payer: Self-pay

## 2021-01-20 DIAGNOSIS — N6489 Other specified disorders of breast: Secondary | ICD-10-CM

## 2021-08-10 ENCOUNTER — Telehealth: Payer: Self-pay | Admitting: Family Medicine

## 2021-08-10 NOTE — Telephone Encounter (Signed)
Lindsey Juarez called into access nurse requesting advice. Pt tested positive for covid and c/o congestion, ear ache, headache, body aches, fever and a cough.  Access nurse instructed patient to care for symptoms at home.   Called and spoke with Lindsey Juarez. Lindsey Juarez states that she tested positive on 08/10/21 and she is having the headache, body ache, and cough. Pt states that she believes that she is fine and declines going to urgent care for her symptoms. Pt states that she will go to urgent care or schedule for a mychart urgent care visit tomorrow if her symptoms get any worse. Pt declines being in any distress or discomfort. Pt declines SOB or chest pain. Pt states that she just wants to sleep and thanked me for calling.

## 2021-08-10 NOTE — Telephone Encounter (Signed)
Noted.  Please follow-up and see how she is feeling on Thursday.  She needs to remain quarantined at home for 10 days from the onset of her symptoms.  If she develops shortness of breath, cough productive of blood, or fevers of 103 F or higher she needs to seek medical attention in person.

## 2021-08-10 NOTE — Telephone Encounter (Signed)
Patient informed, Due to the high volume of calls and your symptoms we have to forward your call to our Triage Nurse to expedient your call. Please hold for the transfer.  Patient transferred to Access Nurse. Due to recently testing positive for COVID and is unsure what the protocol is after a positive test.No appointments in office or virtual.

## 2021-11-16 ENCOUNTER — Encounter: Payer: Managed Care, Other (non HMO) | Admitting: Family Medicine

## 2021-11-21 ENCOUNTER — Encounter: Payer: Managed Care, Other (non HMO) | Admitting: Family Medicine

## 2022-01-06 LAB — HM MAMMOGRAPHY

## 2022-02-16 ENCOUNTER — Other Ambulatory Visit: Payer: Self-pay

## 2022-02-16 DIAGNOSIS — Z0001 Encounter for general adult medical examination with abnormal findings: Secondary | ICD-10-CM

## 2022-02-20 ENCOUNTER — Other Ambulatory Visit (INDEPENDENT_AMBULATORY_CARE_PROVIDER_SITE_OTHER): Payer: Managed Care, Other (non HMO)

## 2022-02-20 ENCOUNTER — Other Ambulatory Visit: Payer: Self-pay

## 2022-02-20 DIAGNOSIS — Z0001 Encounter for general adult medical examination with abnormal findings: Secondary | ICD-10-CM

## 2022-02-20 LAB — CBC
HCT: 38.8 % (ref 36.0–46.0)
Hemoglobin: 13 g/dL (ref 12.0–15.0)
MCHC: 33.4 g/dL (ref 30.0–36.0)
MCV: 93.3 fl (ref 78.0–100.0)
Platelets: 276 10*3/uL (ref 150.0–400.0)
RBC: 4.16 Mil/uL (ref 3.87–5.11)
RDW: 13.1 % (ref 11.5–15.5)
WBC: 5.6 10*3/uL (ref 4.0–10.5)

## 2022-02-20 LAB — LIPID PANEL
Cholesterol: 199 mg/dL (ref 0–200)
HDL: 68.4 mg/dL (ref >39.00)
LDL Cholesterol: 118 mg/dL — ABNORMAL HIGH (ref 0–99)
NonHDL: 130.37
Total CHOL/HDL Ratio: 3
Triglycerides: 60 mg/dL (ref 0.0–149.0)
VLDL: 12 mg/dL (ref 0.0–40.0)

## 2022-02-20 LAB — COMPREHENSIVE METABOLIC PANEL
ALT: 17 U/L (ref 0–35)
AST: 17 U/L (ref 0–37)
Albumin: 4.6 g/dL (ref 3.5–5.2)
Alkaline Phosphatase: 47 U/L (ref 39–117)
BUN: 12 mg/dL (ref 6–23)
CO2: 32 mEq/L (ref 19–32)
Calcium: 9.8 mg/dL (ref 8.4–10.5)
Chloride: 101 mEq/L (ref 96–112)
Creatinine, Ser: 0.75 mg/dL (ref 0.40–1.20)
GFR: 98.34 mL/min (ref >60.00)
Glucose, Bld: 72 mg/dL (ref 70–99)
Potassium: 4.1 mEq/L (ref 3.5–5.1)
Sodium: 137 mEq/L (ref 135–145)
Total Bilirubin: 0.8 mg/dL (ref 0.2–1.2)
Total Protein: 7.4 g/dL (ref 6.0–8.3)

## 2022-02-21 LAB — TSH: TSH: 2.39 u[IU]/mL (ref 0.35–5.50)

## 2022-02-21 LAB — VITAMIN D 25 HYDROXY (VIT D DEFICIENCY, FRACTURES): VITD: 34.12 ng/mL (ref 30.00–100.00)

## 2022-02-23 ENCOUNTER — Ambulatory Visit (INDEPENDENT_AMBULATORY_CARE_PROVIDER_SITE_OTHER): Payer: Managed Care, Other (non HMO) | Admitting: Podiatry

## 2022-02-23 ENCOUNTER — Other Ambulatory Visit: Payer: Self-pay

## 2022-02-23 ENCOUNTER — Encounter: Payer: Self-pay | Admitting: Family Medicine

## 2022-02-23 DIAGNOSIS — R202 Paresthesia of skin: Secondary | ICD-10-CM

## 2022-02-23 DIAGNOSIS — T148XXA Other injury of unspecified body region, initial encounter: Secondary | ICD-10-CM

## 2022-02-23 DIAGNOSIS — R200 Anesthesia of skin: Secondary | ICD-10-CM | POA: Diagnosis not present

## 2022-02-23 NOTE — Progress Notes (Signed)
?  Subjective:  ?Patient ID: Lindsey Juarez, female    DOB: [birth year],  MRN: [id number] ? ?Chief Complaint  ?Patient presents with  ? Numbness  ?  Right hallux toe feels numb like the skin is tight almost like a burn   ? ? ?41 y.o. female presents with the above complaint.  Patient presents with complaint of numbness to the right hallux.  She states that she does a lot of martial arts and kicking on the dorsum as well as the lateral side of the hallux.  Patient states the skin feels tight almost like a burning/burning sensation.  She states that it is not painful per se until she continues to do martial arts.  She wanted to get eval make sure there is nothing going on.  She denies any other acute complaints.  Her pain scale is 2 out of 10.  This is mostly numbness/burning in nature. ? ? ?Review of Systems: Negative except as noted in the HPI. Denies N/V/F/Ch. ? ?Past Medical History:  ?Diagnosis Date  ? Abnormal Pap smear   ? Bicuspid aortic valve   ? Last Echo 07/24/14; Bicuspid; mildly thickened leaflets. Mild stenosis.  ? ? ?Current Outpatient Medications:  ?  levonorgestrel (MIRENA) 20 MCG/24HR IUD, 1 each by Intrauterine route once., Disp: , Rfl:  ?  Multiple Vitamin (MULTIVITAMIN) tablet, Take 1 tablet by mouth daily., Disp: , Rfl:  ? ?Social History  ? ?Tobacco Use  ?Smoking Status Former  ? Packs/day: 0.50  ? Types: Cigarettes  ? Quit date: 12/25/1998  ? Years since quitting: 23.1  ?Smokeless Tobacco Never  ? ? ?Allergies  ?Allergen Reactions  ? Sulfonamide Derivatives Hives and Swelling  ? Penicillins Rash  ? ?Objective:  ?There were no vitals filed for this visit. ?There is no height or weight on file to calculate BMI. ?Constitutional Well developed. ?Well nourished.  ?Vascular Dorsalis pedis pulses palpable bilaterally. ?Posterior tibial pulses palpable bilaterally. ?Capillary refill normal to all digits.  ?No cyanosis or clubbing noted. ?Pedal hair growth normal.  ?Neurologic Normal speech. ?Oriented to  person, place, and time. ?Epicritic sensation to light touch grossly present bilaterally.  ?Dermatologic Negative Tinel's sign.  No underlying exostosis clinically appreciated.  Mild exostosis on the first metatarsophalangeal joint noted likely not affecting the nerve.  ?Orthopedic: Normal joint ROM without pain or crepitus bilaterally. ?No visible deformities. ?No bony tenderness.  ? ?Radiographs: None ?Assessment:  ? ?1. Nerve compression   ?2. Numbness and tingling   ? ?Plan:  ?Patient was evaluated and treated and all questions answered. ? ?Numbness tingling/burning to right hallux ?-All questions and concerns were discussed with the patient in extensive detail ?-I discussed with the patient that this is likely due to martial art and the way the right has cake is done leading to a lot of pressure and stress to the dorsal aspect of the nerve that is responsible for the sensation and I discussed with the patient she will need to rest it for 8 to 10 weeks at minimum to allow the nerve to heal.  At this time I am not too concerned for permanent damage.  Patient has sensation of the foot however numbness at the same time because of martial arts.  She states understanding and will take 8 to 10 weeks break after April. ?-If it continues to bother of asked her to come see me.  She states understanding ? ?No follow-ups on file. ?

## 2022-02-24 ENCOUNTER — Ambulatory Visit (INDEPENDENT_AMBULATORY_CARE_PROVIDER_SITE_OTHER): Payer: Managed Care, Other (non HMO) | Admitting: Family Medicine

## 2022-02-24 ENCOUNTER — Encounter: Payer: Self-pay | Admitting: Family Medicine

## 2022-02-24 ENCOUNTER — Other Ambulatory Visit: Payer: Self-pay

## 2022-02-24 VITALS — BP 110/70 | HR 67 | Temp 97.8°F | Ht 62.5 in | Wt 135.2 lb

## 2022-02-24 DIAGNOSIS — Q231 Congenital insufficiency of aortic valve: Secondary | ICD-10-CM | POA: Diagnosis not present

## 2022-02-24 DIAGNOSIS — Z0000 Encounter for general adult medical examination without abnormal findings: Secondary | ICD-10-CM | POA: Diagnosis not present

## 2022-02-24 NOTE — Assessment & Plan Note (Signed)
Due for follow-up echo.  Her cardiologist previously says she did not need to see them though she could have a periodic echo to follow-up on this.  No murmur noted on exam today. ?

## 2022-02-24 NOTE — Patient Instructions (Signed)
Nice to see you. ?Somebody is going to contact you to get an echo scheduled.  You do not need to see cardiology for follow-up though they did recommend having a periodic echo. ?

## 2022-02-24 NOTE — Progress Notes (Signed)
?Tommi Rumps, MD ?Phone: [phone] ? ?Lindsey Juarez is a 41 y.o. female who presents today for CPE. ? ?Diet: lean meats, vegetables, overall healthy diet ?Exercise: martial arts 3x/week ?Pap smear: through GYN ?Colonoscopy: not indictated ?Mammogram: through GYN ?Family history- ? Colon cancer: no ? Breast cancer: no ? Ovarian cancer: no ?Reports stomach cancer in her maternal grandmother, who also had a stroke at age 24. No other stroke history in her family. No other GI cancer history in her family. ?Menses: IUD ?Vaccines-  ? Flu: declines ? Tetanus: UTD ? COVID19: declines ?HIV screening: UTD ?Hep C Screening: declines ?Tobacco use: no ?Alcohol use: 0-1/week ?Illicit Drug use: no ?Dentist: yes ?Ophthalmology: yes ? ?The 10-year ASCVD risk score (Arnett DK, et al., 2019) is: 0.3% ?  Values used to calculate the score: ?    Age: 61 years ?    Sex: Female ?    Is Non-Hispanic African American: No ?    Diabetic: No ?    Tobacco smoker: No ?    Systolic Blood Pressure: 621 mmHg ?    Is BP treated: No ?    HDL Cholesterol: 68.4 mg/dL ?    Total Cholesterol: 199 mg/dL ? ? ?Active Ambulatory Problems  ?  Diagnosis Date Noted  ? Bicuspid aortic valve 05/06/2009  ? Routine general medical examination at a health care facility 04/03/2018  ? Multiple nevi 04/03/2018  ? ?Resolved Ambulatory Problems  ?  Diagnosis Date Noted  ? MIGRAINE WITH AURA 05/06/2009  ? ALLERGIC RHINITIS WITH CONJUNCTIVITIS 06/07/2010  ? TACHYCARDIA 06/13/2010  ? PAP SMEAR, ABNORMAL, ASCUS 05/06/2009  ? Syncope 04/06/2012  ? PP care - s/p NVB 4/12 04/06/2012  ? Indication for care or intervention in labor or delivery 09/24/2014  ? Postpartum care following vaginal delivery (10/1) 09/24/2014  ? Postpartum hemorrhage 09/24/2014  ? Otitis media 01/20/2016  ? Bleeding from ear 01/20/2016  ? Annual physical exam 02/27/2017  ? ?Past Medical History:  ?Diagnosis Date  ? Abnormal Pap smear   ? ? ?Family History  ?Problem Relation Age of Onset  ?  Goiter Father   ? Hypertension Father   ? Hypertension Mother   ?     also high chol and hyperthyroid after pregnancy  ? Hypothyroidism Sister   ? Heart disease Paternal Grandfather   ?     ?cancer  ? Coronary artery disease Maternal Grandfather   ?     CABG at 40  ? Stroke Maternal Grandmother 40  ?     high chol  ? Anesthesia problems Neg Hx   ? ? ?Social History  ? ?Socioeconomic History  ? Marital status: Married  ?  Spouse name: Not on file  ? Number of children: Not on file  ? Years of education: Not on file  ? Highest education level: Not on file  ?Occupational History  ? Not on file  ?Tobacco Use  ? Smoking status: Former  ?  Packs/day: 0.50  ?  Types: Cigarettes  ?  Quit date: 12/25/1998  ?  Years since quitting: 23.1  ? Smokeless tobacco: Never  ?Vaping Use  ? Vaping Use: Never used  ?Substance and Sexual Activity  ? Alcohol use: No  ? Drug use: No  ? Sexual activity: Yes  ?  Birth control/protection: None, I.U.D.  ?Other Topics Concern  ? Not on file  ?Social History Narrative  ? 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  ? Diet: fruit, veggies, water  ? ?  Social Determinants of Health  ? ?Financial Resource Strain: Not on file  ?Food Insecurity: Not on file  ?Transportation Needs: Not on file  ?Physical Activity: Not on file  ?Stress: Not on file  ?Social Connections: Not on file  ?Intimate Partner Violence: Not on file  ? ? ?ROS ? ?General:  Negative for nexplained weight loss, fever ?Skin: Negative for new or changing mole, sore that won't heal ?HEENT: Negative for trouble hearing, trouble seeing, ringing in ears, mouth sores, hoarseness, change in voice, dysphagia. ?CV:  Negative for chest pain, dyspnea, edema, palpitations ?Resp: Negative for cough, dyspnea, hemoptysis ?GI: Negative for nausea, vomiting, diarrhea, constipation, abdominal pain, melena, hematochezia. ?GU: Negative for dysuria, incontinence, urinary hesitance, hematuria, vaginal or penile discharge, polyuria, sexual  difficulty, lumps in testicle or breasts ?MSK: Negative for muscle cramps or aches, joint pain or swelling ?Neuro: Negative for headaches, weakness, numbness, dizziness, passing out/fainting ?Psych: Negative for depression, anxiety, memory problems ? ?Objective ? ?Physical Exam ?Vitals:  ? 02/24/22 0953  ?BP: 110/70  ?Pulse: 67  ?Temp: 97.8 ?F (36.6 ?C)  ?SpO2: 98%  ? ? ?BP Readings from Last 3 Encounters:  ?02/24/22 110/70  ?11/16/20 110/70  ?02/13/20 99/69  ? ?Wt Readings from Last 3 Encounters:  ?02/24/22 135 lb 3.2 oz (61.3 kg)  ?11/16/20 130 lb (59 kg)  ?02/13/20 115 lb (52.2 kg)  ? ? ?Physical Exam ?Constitutional:   ?   General: She is not in acute distress. ?   Appearance: She is not diaphoretic.  ?HENT:  ?   Head: Normocephalic and atraumatic.  ?Cardiovascular:  ?   Rate and Rhythm: Normal rate and regular rhythm.  ?   Heart sounds: Normal heart sounds.  ?Pulmonary:  ?   Effort: Pulmonary effort is normal.  ?   Breath sounds: Normal breath sounds.  ?Abdominal:  ?   General: Bowel sounds are normal. There is no distension.  ?   Palpations: Abdomen is soft.  ?   Tenderness: There is no abdominal tenderness.  ?Musculoskeletal:  ?   Right lower leg: No edema.  ?   Left lower leg: No edema.  ?Lymphadenopathy:  ?   Cervical: No cervical adenopathy.  ?Skin: ?   General: Skin is warm and dry.  ?Neurological:  ?   Mental Status: She is alert.  ?Psychiatric:     ?   Mood and Affect: Mood normal.  ? ? ? ?Assessment/Plan:  ? ?Problem List Items Addressed This Visit   ? ? Bicuspid aortic valve  ?  Due for follow-up echo.  Her cardiologist previously says she did not need to see them though she could have a periodic echo to follow-up on this.  No murmur noted on exam today. ?  ?  ? Relevant Orders  ? ECHOCARDIOGRAM COMPLETE  ? Routine general medical examination at a health care facility - Primary  ?  Physical exam completed.  I encouraged continued healthy diet and exercise.  We will request Pap smear and mammogram  results.  Gynecologic and breast exam deferred to GYN.  She declines flu and COVID vaccines.  She declines hepatitis C screening given that she is low risk.  Lab work was reviewed with the patient.  LDL mildly elevated though her ASCVD risk score is quite low. ?  ?  ? ? ?Return in about 1 year (around 02/25/2023) for CPE. ? ?This visit occurred during the SARS-CoV-2 public health emergency.  Safety protocols were in place, including screening questions prior to  the visit, additional usage of staff PPE, and extensive cleaning of exam room while observing appropriate contact time as indicated for disinfecting solutions.  ? ? ?Tommi Rumps, MD ?[place] ? ?

## 2022-02-24 NOTE — Assessment & Plan Note (Signed)
Physical exam completed.  I encouraged continued healthy diet and exercise.  We will request Pap smear and mammogram results.  Gynecologic and breast exam deferred to GYN.  She declines flu and COVID vaccines.  She declines hepatitis C screening given that she is low risk.  Lab work was reviewed with the patient.  LDL mildly elevated though her ASCVD risk score is quite low. ?

## 2022-03-27 IMAGING — MG MM DIGITAL DIAGNOSTIC UNILAT*L* W/ TOMO W/ CAD
6 series · 6 of 18 positions shown · non-contrast
Comparison: Screening exam, which was the baseline study,
12/08/2020.

CLINICAL DATA: Screening recall for a possible left breast
asymmetry.

EXAM:
DIGITAL DIAGNOSTIC LEFT MAMMOGRAM WITH CAD AND TOMOSYNTHESIS
ULTRASOUND LEFT BREAST
TECHNIQUE: Left digital diagnostic mammography and breast tomosynthesis was
performed. Digital images of the breasts were evaluated with
computer-aided detection. Targeted ultrasound examination of the
Left breast was performed.

[L MLO synth-2D]
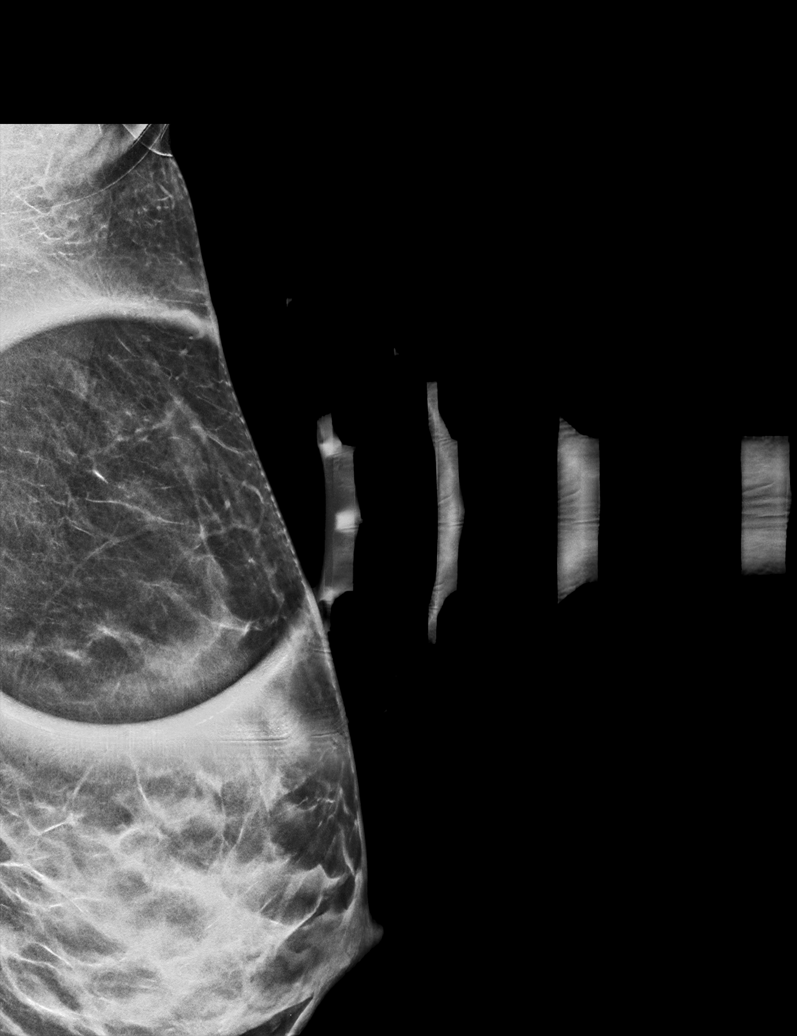

[L ML synth-2D]
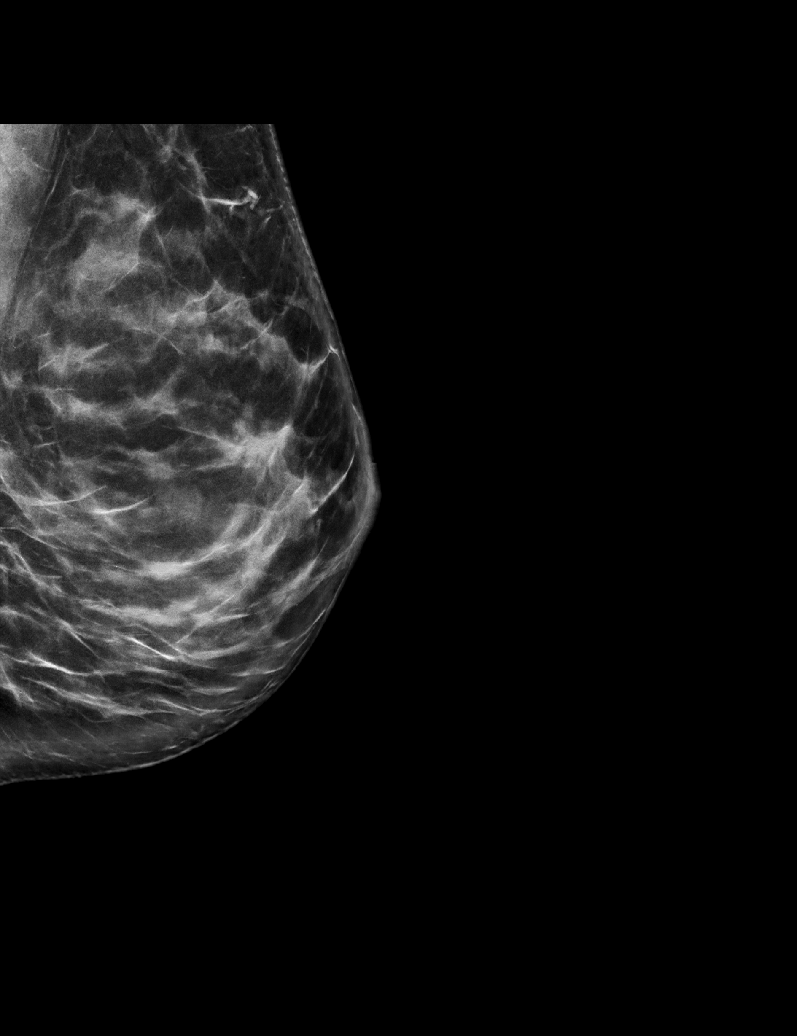

[L XCCL synth-2D]
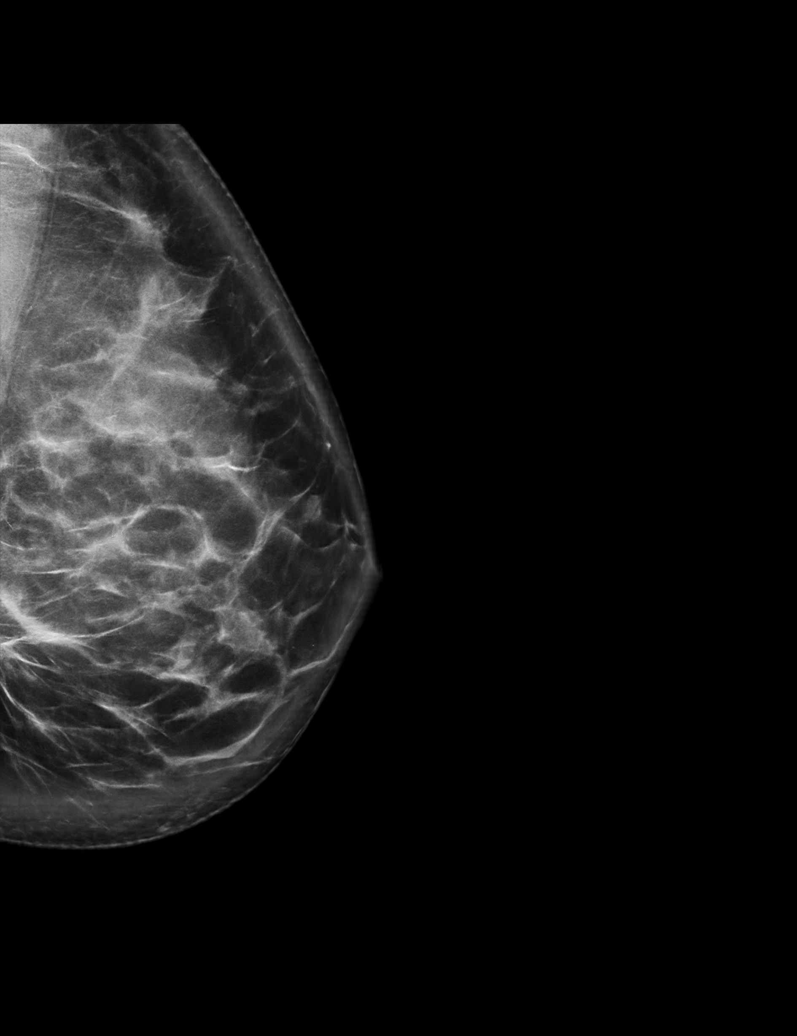

[L XCCL tomo · tomo slice 45/88.0]
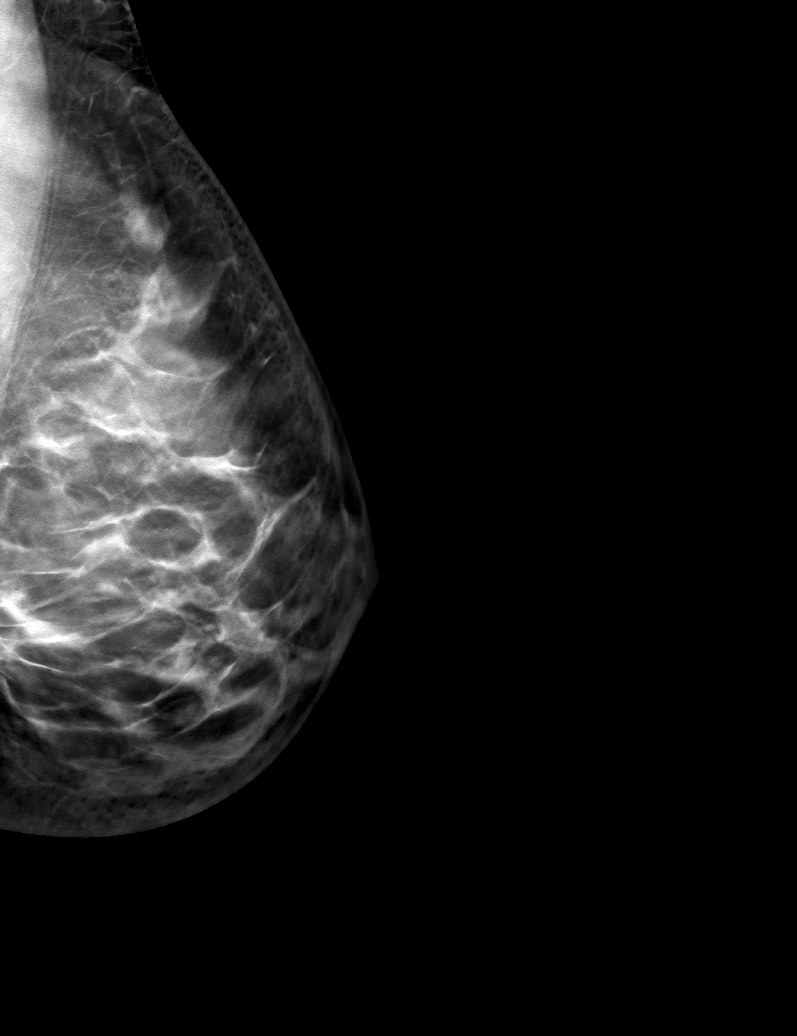

[L MLO tomo · tomo slice 29/56.0]
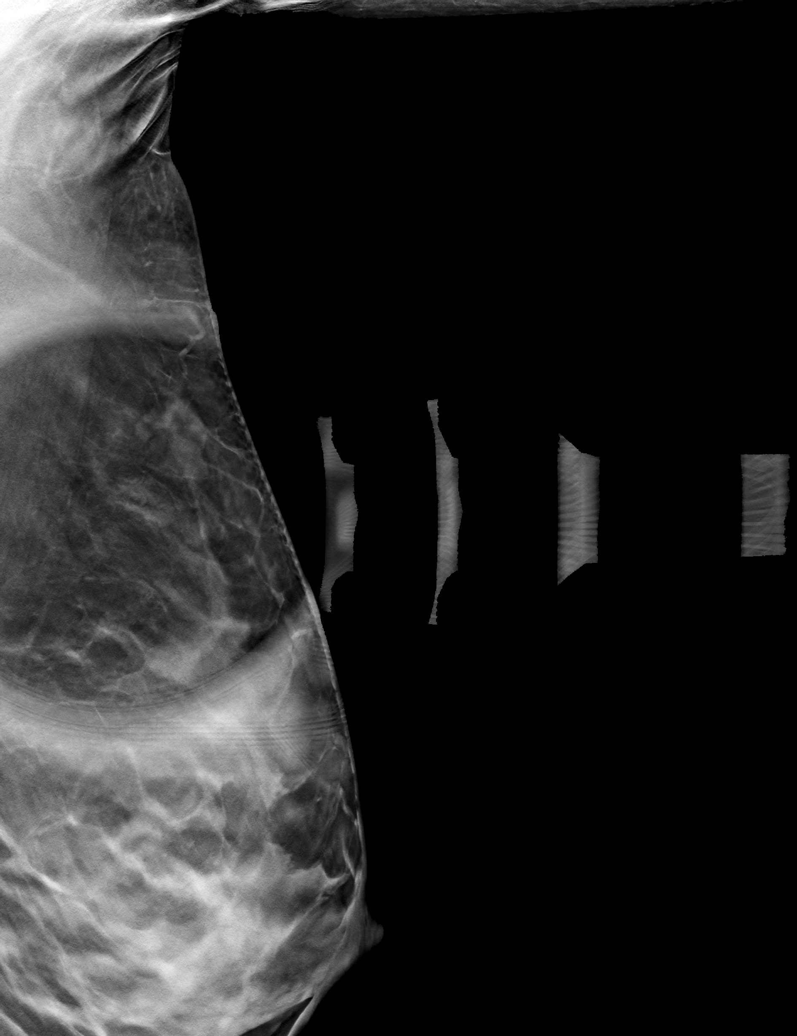

[L ML tomo · tomo slice 39/78.0]
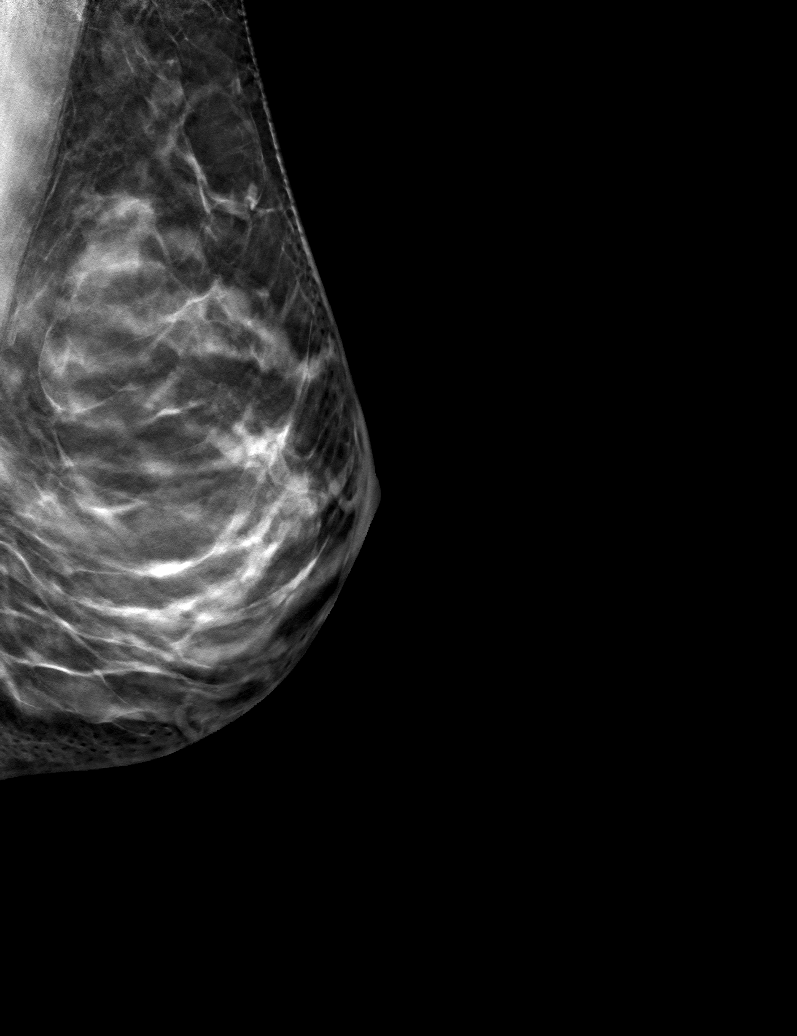

[6 of 18 positions shown; findings below may reference images not displayed]

ACR Breast Density Category c: The breast tissue is heterogeneously
dense, which may obscure small masses.
FINDINGS: The area of asymmetry, which is in the axillary tail region of the
left breast, partly disperses on spot compression imaging consistent
with fibroglandular tissue. There is no underlying mass and no
architectural distortion. There are no suspicious calcifications.

On physical exam, no mass is palpated in the far upper outer left
breast or left axilla.

Targeted ultrasound is performed, showing normal tissue throughout
the left axillary tail region and left axilla. No mass. No enlarged
or abnormal lymph nodes. Fibroglandular tissue is noted in the
axillary tail region corresponding to the area of asymmetry seen
mammographically.
IMPRESSION: No evidence of breast malignancy.

RECOMMENDATION:
Screening mammogram in one year.(Code:09-[id number])

I have discussed the findings and recommendations with the patient.
If applicable, a reminder letter will be sent to the patient
regarding the next appointment.

BI-RADS CATEGORY  1: Negative.

## 2022-12-01 ENCOUNTER — Ambulatory Visit
Admission: RE | Admit: 2022-12-01 | Discharge: 2022-12-01 | Disposition: A | Discharge status: Home / Self Care | Payer: Managed Care, Other (non HMO) | Attending: Emergency Medicine | Admitting: Emergency Medicine

## 2022-12-01 ENCOUNTER — Ambulatory Visit
Admission: RE | Admit: 2022-12-01 | Discharge: 2022-12-01 | Disposition: A | Discharge status: Home / Self Care | Payer: Managed Care, Other (non HMO) | Source: Ambulatory Visit | Attending: Emergency Medicine | Admitting: Emergency Medicine

## 2022-12-01 ENCOUNTER — Other Ambulatory Visit: Payer: Self-pay | Admitting: Emergency Medicine

## 2022-12-01 DIAGNOSIS — S2232XA Fracture of one rib, left side, initial encounter for closed fracture: Secondary | ICD-10-CM

## 2022-12-11 ENCOUNTER — Ambulatory Visit: Payer: Managed Care, Other (non HMO) | Admitting: Family

## 2023-01-08 ENCOUNTER — Ambulatory Visit: Payer: Managed Care, Other (non HMO) | Attending: Family Medicine

## 2023-01-08 DIAGNOSIS — Q231 Congenital insufficiency of aortic valve: Secondary | ICD-10-CM | POA: Diagnosis not present

## 2023-01-08 LAB — ECHOCARDIOGRAM COMPLETE
AR max vel: 1.78 cm2
AV Area VTI: 1.69 cm2
AV Area mean vel: 1.77 cm2
AV Mean grad: 5 mmHg
AV Peak grad: 10.1 mmHg
AV Vena cont: 0.3 cm
Ao pk vel: 1.59 m/s
Area-P 1/2: 3.27 cm2
P 1/2 time: 1047 msec
S' Lateral: 2.9 cm

## 2023-02-28 ENCOUNTER — Encounter: Payer: Managed Care, Other (non HMO) | Admitting: Family Medicine

## 2024-03-07 ENCOUNTER — Telehealth: Payer: Self-pay | Admitting: Family Medicine

## 2024-03-07 NOTE — Telephone Encounter (Signed)
 Dr Birdie Sons is no longer at this location. Please call the office to schedule a Transfer of Care to either Dr Charlann Lange, Darleen Crocker or Kara Dies, NP. Purvis Sheffield, please schedule TOC visit for this patient. [facility]   Thank you
